# Patient Record
Sex: Male | Born: 1935
Health system: Southern US, Community
[De-identification: ages and names within clinical notes are randomized; demographics above are authoritative.]

## PROBLEM LIST (undated history)

## (undated) DIAGNOSIS — F32A Depression, unspecified: Secondary | ICD-10-CM

## (undated) DIAGNOSIS — I10 Essential (primary) hypertension: Secondary | ICD-10-CM

## (undated) DIAGNOSIS — G629 Polyneuropathy, unspecified: Secondary | ICD-10-CM

## (undated) DIAGNOSIS — E7800 Pure hypercholesterolemia, unspecified: Secondary | ICD-10-CM

## (undated) DIAGNOSIS — I639 Cerebral infarction, unspecified: Secondary | ICD-10-CM

## (undated) DIAGNOSIS — E538 Deficiency of other specified B group vitamins: Secondary | ICD-10-CM

## (undated) DIAGNOSIS — E119 Type 2 diabetes mellitus without complications: Secondary | ICD-10-CM

## (undated) DIAGNOSIS — R7303 Prediabetes: Secondary | ICD-10-CM

## (undated) DIAGNOSIS — K219 Gastro-esophageal reflux disease without esophagitis: Secondary | ICD-10-CM

## (undated) DIAGNOSIS — F329 Major depressive disorder, single episode, unspecified: Secondary | ICD-10-CM

## (undated) HISTORY — DX: Depression, unspecified: F32.A

## (undated) HISTORY — DX: Type 2 diabetes mellitus without complications: E11.9

## (undated) HISTORY — DX: Deficiency of other specified B group vitamins: E53.8

## (undated) HISTORY — PX: APPENDECTOMY: SHX54

## (undated) HISTORY — PX: CATARACT EXTRACTION: SUR2

## (undated) HISTORY — DX: Pure hypercholesterolemia, unspecified: E78.00

## (undated) HISTORY — DX: Major depressive disorder, single episode, unspecified: F32.9

## (undated) HISTORY — DX: Gastro-esophageal reflux disease without esophagitis: K21.9

---

## 1998-12-28 ENCOUNTER — Ambulatory Visit (HOSPITAL_COMMUNITY): Admission: RE | Admit: 1998-12-28 | Discharge: 1998-12-28 | Payer: Self-pay | Admitting: Endocrinology

## 1998-12-28 ENCOUNTER — Encounter: Payer: Self-pay | Admitting: Endocrinology

## 2013-12-23 ENCOUNTER — Encounter: Payer: Self-pay | Admitting: Neurology

## 2014-01-09 ENCOUNTER — Ambulatory Visit (INDEPENDENT_AMBULATORY_CARE_PROVIDER_SITE_OTHER): Payer: Medicare PPO | Admitting: Neurology

## 2014-01-09 ENCOUNTER — Encounter: Payer: Self-pay | Admitting: Neurology

## 2014-01-09 VITALS — BP 150/66 | HR 96 | Resp 25 | Ht 70.0 in | Wt 188.0 lb

## 2014-01-09 DIAGNOSIS — E1149 Type 2 diabetes mellitus with other diabetic neurological complication: Secondary | ICD-10-CM

## 2014-01-09 DIAGNOSIS — E1142 Type 2 diabetes mellitus with diabetic polyneuropathy: Secondary | ICD-10-CM

## 2014-01-09 NOTE — Progress Notes (Signed)
Joseph Arellano was seen today in the movement disorders clinic for neurologic consultation at the request of Julian Hy, MD.  The consultation is for the evaluation of falls, unsteady gait and to r/o PD.  Pt states that he fell about 1 month ago and that is what got him here.  He was outside walking in the park and doesn't know if he tripped over an uneven sidewalk, but he fell on his face; he was most concerned that he didn't use his hands to catch the fall.   No LOC.  No other falls.  He has had some unsteady gait but relates that to dizziness when he first gets up.      Specific Symptoms:  Tremor: No. Voice: no changes Sleep: sleeps well  Vivid Dreams:  Yes.    Acting out dreams:  No. Wet Pillows: No. Postural symptoms:  Yes.    Falls?  Yes.  , only one, as above Bradykinesia symptoms: no bradykinesia noted Loss of smell:  Yes.   but it never worked well Loss of taste:  Yes.   Urinary Incontinence:  No. Difficulty Swallowing:  No. Handwriting, micrographia: No. Trouble with ADL's:  No.  Trouble buttoning clothing: No. Depression:  No. Memory changes:  No. Hallucinations:  No.  visual distortions: No. N/V:  No. Lightheaded:  Yes.    Syncope: No. Diplopia:  No. Dyskinesia:  No.  Neuroimaging has not previously been performed (at least in the last 20 years).    PREVIOUS MEDICATIONS: none to date  ALLERGIES:  No Known Allergies  CURRENT MEDICATIONS:  Outpatient Encounter Prescriptions as of 01/09/2014  Medication Sig  . citalopram (CELEXA) 40 MG tablet Take 40 mg by mouth daily.  . colestipol (COLESTID) 1 G tablet Take 1 g by mouth 2 (two) times daily.  Marland Kitchen FIBER PO Take by mouth.  . glyBURIDE micronized (GLYNASE) 3 MG tablet Take 3 mg by mouth daily with breakfast.  . magnesium oxide (MAG-OX) 400 MG tablet Take 400 mg by mouth daily.  . metFORMIN (GLUCOPHAGE) 1000 MG tablet Take 1,000 mg by mouth daily.   . Multiple Vitamin (MULTIVITAMIN) tablet Take 1 tablet  by mouth daily.  . Omega-3 Fatty Acids (FISH OIL PO) Take by mouth.  Marland Kitchen omeprazole (PRILOSEC) 20 MG capsule Take 20 mg by mouth daily.  . Probiotic Product (PROBIOTIC PO) Take by mouth.  . vitamin B-12 (CYANOCOBALAMIN) 1000 MCG tablet Take 1,000 mcg by mouth daily.  . [DISCONTINUED] Lactobacillus (ACIDOPHILUS PO) Take by mouth daily.    PAST MEDICAL HISTORY:   Past Medical History  Diagnosis Date  . Depression   . B12 deficiency   . GERD (gastroesophageal reflux disease)   . Diabetes   . Hypercholesteremia     PAST SURGICAL HISTORY:   Past Surgical History  Procedure Laterality Date  . Cataract extraction Bilateral     SOCIAL HISTORY:   History   Social History  . Marital Status: Married    Spouse Name: N/A    Number of Children: N/A  . Years of Education: N/A   Occupational History  . retired     Diplomatic Services operational officer   Social History Main Topics  . Smoking status: Former Games developer  . Smokeless tobacco: Not on file  . Alcohol Use: Yes     Comment: glass of wine every night (sometimes 2 max)  . Drug Use: No  . Sexual Activity: Not on file   Other Topics Concern  . Not on file  Social History Narrative  . No narrative on file    FAMILY HISTORY:   Family Status  Relation Status Death Age  . Father Deceased     pneumonia, diabetes  . Mother Deceased     hydrocephalous  . Sister Alive     healthy  . Daughter Alive     seizures  . Daughter Alive     healthy  . Daughter Alive     healthy  . Daughter Alive     healthy    ROS:  Has paresthesias in the feet.  A complete 10 system review of systems was obtained and was unremarkable apart from what is mentioned above.  PHYSICAL EXAMINATION:    VITALS:   Filed Vitals:   01/09/14 0927  BP: 150/66  Pulse: 96  Resp: 25  Height:  (1.778 m)  Weight: 188 lb (85.276 kg)    GEN:  The patient appears stated age and is in NAD. HEENT:  Normocephalic, atraumatic.  The mucous membranes are moist. The superficial  temporal arteries are without ropiness or tenderness. CV:  RRR Lungs:  CTAB Neck/HEME:  There are no carotid bruits bilaterally.  Neurological examination:  Orientation: The patient is alert and oriented x3. Fund of knowledge is appropriate.  Recent and remote memory are intact.  Attention and concentration are normal.    Able to name objects and repeat phrases. Cranial nerves: There is good facial symmetry. Pupils are equal round and reactive to light bilaterally. Fundoscopic exam reveals clear margins bilaterally. Extraocular muscles are intact. The visual fields are full to confrontational testing. The speech is fluent and clear. Soft palate rises symmetrically and there is no tongue deviation. Hearing is intact to conversational tone. Sensation: Sensation is intact to light and pinprick throughout (facial, trunk, extremities). Vibration is decreased at the bilateral big toe. There is no extinction with double simultaneous stimulation. There is no sensory dermatomal level identified. Motor: Strength is 5/5 in the bilateral upper and lower extremities.   Shoulder shrug is equal and symmetric.  There is no pronator drift. Deep tendon reflexes: Deep tendon reflexes are 2/4 at the bilateral biceps, triceps, brachioradialis, patella and absent at the bilateral achilles. Plantar responses are downgoing bilaterally.  Movement examination: Tone: There is normal tone in the bilateral upper extremities.  The tone in the lower extremities is normal.  Abnormal movements: none Coordination:  There is no decremation with RAM's Gait and Station: The patient has no difficulty arising out of a deep-seated chair without the use of the hands. The patient's stride length is normal with normal arm swing.  There is mild trouble with tandem gait.  Okay heel/toe walk.  Stands in romberg position without trouble.Marland Kitchen      LABS:    The patient did bring in lab work from [facility] dated 12/17/2013.  White blood  cells were 7.8, hemoglobin 13.4, hematocrit 39.4 and platelets 266.  BUN was 18 and creatinine 1.1, sodium 141, potassium 4.5, chloride 106, CO2 28, glucose was elevated at 244.  AST was 37, ALT was 37.  ASSESSMENT/PLAN:  1.  Gait instability.  -long talk with the patient today.  I see no evidence of a neurodegenerative condition like PD.    -I do think that the patient has peripheral neuropathy, likely due to DM.  Talked about safety.  Greater than 50% of visit in counseling.    -Next time that labs are drawn at PCP, would recommend drawing folate, RPR, SPEP/UPEP with immunofix to  make sure not missing other reversible causes of PN.  He is on B12 injections and reports last HgBA1C was 7.0  -f/u prn

## 2014-11-30 DIAGNOSIS — Z6826 Body mass index (BMI) 26.0-26.9, adult: Secondary | ICD-10-CM | POA: Diagnosis not present

## 2014-11-30 DIAGNOSIS — E1142 Type 2 diabetes mellitus with diabetic polyneuropathy: Secondary | ICD-10-CM | POA: Diagnosis not present

## 2014-11-30 DIAGNOSIS — D510 Vitamin B12 deficiency anemia due to intrinsic factor deficiency: Secondary | ICD-10-CM | POA: Diagnosis not present

## 2014-11-30 DIAGNOSIS — R946 Abnormal results of thyroid function studies: Secondary | ICD-10-CM | POA: Diagnosis not present

## 2014-11-30 DIAGNOSIS — F329 Major depressive disorder, single episode, unspecified: Secondary | ICD-10-CM | POA: Diagnosis not present

## 2014-11-30 DIAGNOSIS — K219 Gastro-esophageal reflux disease without esophagitis: Secondary | ICD-10-CM | POA: Diagnosis not present

## 2014-11-30 DIAGNOSIS — R269 Unspecified abnormalities of gait and mobility: Secondary | ICD-10-CM | POA: Diagnosis not present

## 2014-11-30 DIAGNOSIS — E1140 Type 2 diabetes mellitus with diabetic neuropathy, unspecified: Secondary | ICD-10-CM | POA: Diagnosis not present

## 2014-12-04 DIAGNOSIS — E1140 Type 2 diabetes mellitus with diabetic neuropathy, unspecified: Secondary | ICD-10-CM | POA: Diagnosis not present

## 2014-12-04 DIAGNOSIS — R269 Unspecified abnormalities of gait and mobility: Secondary | ICD-10-CM | POA: Diagnosis not present

## 2014-12-04 DIAGNOSIS — R42 Dizziness and giddiness: Secondary | ICD-10-CM | POA: Diagnosis not present

## 2014-12-04 DIAGNOSIS — Z6826 Body mass index (BMI) 26.0-26.9, adult: Secondary | ICD-10-CM | POA: Diagnosis not present

## 2015-03-01 DIAGNOSIS — E1142 Type 2 diabetes mellitus with diabetic polyneuropathy: Secondary | ICD-10-CM | POA: Diagnosis not present

## 2015-03-01 DIAGNOSIS — F329 Major depressive disorder, single episode, unspecified: Secondary | ICD-10-CM | POA: Diagnosis not present

## 2015-03-01 DIAGNOSIS — E785 Hyperlipidemia, unspecified: Secondary | ICD-10-CM | POA: Diagnosis not present

## 2015-03-01 DIAGNOSIS — R269 Unspecified abnormalities of gait and mobility: Secondary | ICD-10-CM | POA: Diagnosis not present

## 2015-03-01 DIAGNOSIS — D510 Vitamin B12 deficiency anemia due to intrinsic factor deficiency: Secondary | ICD-10-CM | POA: Diagnosis not present

## 2015-03-01 DIAGNOSIS — R946 Abnormal results of thyroid function studies: Secondary | ICD-10-CM | POA: Diagnosis not present

## 2015-03-01 DIAGNOSIS — K219 Gastro-esophageal reflux disease without esophagitis: Secondary | ICD-10-CM | POA: Diagnosis not present

## 2015-03-01 DIAGNOSIS — E1149 Type 2 diabetes mellitus with other diabetic neurological complication: Secondary | ICD-10-CM | POA: Diagnosis not present

## 2015-03-01 DIAGNOSIS — E1140 Type 2 diabetes mellitus with diabetic neuropathy, unspecified: Secondary | ICD-10-CM | POA: Diagnosis not present

## 2015-05-25 DIAGNOSIS — H903 Sensorineural hearing loss, bilateral: Secondary | ICD-10-CM | POA: Diagnosis not present

## 2015-06-29 DIAGNOSIS — E784 Other hyperlipidemia: Secondary | ICD-10-CM | POA: Diagnosis not present

## 2015-06-29 DIAGNOSIS — R946 Abnormal results of thyroid function studies: Secondary | ICD-10-CM | POA: Diagnosis not present

## 2015-06-29 DIAGNOSIS — E1142 Type 2 diabetes mellitus with diabetic polyneuropathy: Secondary | ICD-10-CM | POA: Diagnosis not present

## 2015-06-29 DIAGNOSIS — D510 Vitamin B12 deficiency anemia due to intrinsic factor deficiency: Secondary | ICD-10-CM | POA: Diagnosis not present

## 2015-06-29 DIAGNOSIS — Z1389 Encounter for screening for other disorder: Secondary | ICD-10-CM | POA: Diagnosis not present

## 2015-06-29 DIAGNOSIS — E1140 Type 2 diabetes mellitus with diabetic neuropathy, unspecified: Secondary | ICD-10-CM | POA: Diagnosis not present

## 2015-06-29 DIAGNOSIS — Z6826 Body mass index (BMI) 26.0-26.9, adult: Secondary | ICD-10-CM | POA: Diagnosis not present

## 2015-06-29 DIAGNOSIS — R2689 Other abnormalities of gait and mobility: Secondary | ICD-10-CM | POA: Diagnosis not present

## 2015-06-29 DIAGNOSIS — K219 Gastro-esophageal reflux disease without esophagitis: Secondary | ICD-10-CM | POA: Diagnosis not present

## 2015-08-02 DIAGNOSIS — E1140 Type 2 diabetes mellitus with diabetic neuropathy, unspecified: Secondary | ICD-10-CM | POA: Diagnosis not present

## 2015-08-02 DIAGNOSIS — Z6826 Body mass index (BMI) 26.0-26.9, adult: Secondary | ICD-10-CM | POA: Diagnosis not present

## 2015-10-20 DIAGNOSIS — E1140 Type 2 diabetes mellitus with diabetic neuropathy, unspecified: Secondary | ICD-10-CM | POA: Diagnosis not present

## 2015-10-20 DIAGNOSIS — Z6826 Body mass index (BMI) 26.0-26.9, adult: Secondary | ICD-10-CM | POA: Diagnosis not present

## 2015-11-09 DIAGNOSIS — R2689 Other abnormalities of gait and mobility: Secondary | ICD-10-CM | POA: Diagnosis not present

## 2015-11-09 DIAGNOSIS — E1149 Type 2 diabetes mellitus with other diabetic neurological complication: Secondary | ICD-10-CM | POA: Diagnosis not present

## 2015-11-09 DIAGNOSIS — E784 Other hyperlipidemia: Secondary | ICD-10-CM | POA: Diagnosis not present

## 2015-11-09 DIAGNOSIS — M109 Gout, unspecified: Secondary | ICD-10-CM | POA: Diagnosis not present

## 2015-11-09 DIAGNOSIS — R946 Abnormal results of thyroid function studies: Secondary | ICD-10-CM | POA: Diagnosis not present

## 2015-11-09 DIAGNOSIS — Z6826 Body mass index (BMI) 26.0-26.9, adult: Secondary | ICD-10-CM | POA: Diagnosis not present

## 2015-11-09 DIAGNOSIS — D510 Vitamin B12 deficiency anemia due to intrinsic factor deficiency: Secondary | ICD-10-CM | POA: Diagnosis not present

## 2015-11-09 DIAGNOSIS — K219 Gastro-esophageal reflux disease without esophagitis: Secondary | ICD-10-CM | POA: Diagnosis not present

## 2015-12-28 DIAGNOSIS — L814 Other melanin hyperpigmentation: Secondary | ICD-10-CM | POA: Diagnosis not present

## 2015-12-28 DIAGNOSIS — L821 Other seborrheic keratosis: Secondary | ICD-10-CM | POA: Diagnosis not present

## 2015-12-28 DIAGNOSIS — L570 Actinic keratosis: Secondary | ICD-10-CM | POA: Diagnosis not present

## 2015-12-28 DIAGNOSIS — L820 Inflamed seborrheic keratosis: Secondary | ICD-10-CM | POA: Diagnosis not present

## 2015-12-28 DIAGNOSIS — D225 Melanocytic nevi of trunk: Secondary | ICD-10-CM | POA: Diagnosis not present

## 2015-12-28 DIAGNOSIS — D485 Neoplasm of uncertain behavior of skin: Secondary | ICD-10-CM | POA: Diagnosis not present

## 2015-12-28 DIAGNOSIS — D1801 Hemangioma of skin and subcutaneous tissue: Secondary | ICD-10-CM | POA: Diagnosis not present

## 2016-02-29 DIAGNOSIS — E1149 Type 2 diabetes mellitus with other diabetic neurological complication: Secondary | ICD-10-CM | POA: Diagnosis not present

## 2016-02-29 DIAGNOSIS — Z6826 Body mass index (BMI) 26.0-26.9, adult: Secondary | ICD-10-CM | POA: Diagnosis not present

## 2016-03-14 DIAGNOSIS — M109 Gout, unspecified: Secondary | ICD-10-CM | POA: Diagnosis not present

## 2016-03-14 DIAGNOSIS — E1140 Type 2 diabetes mellitus with diabetic neuropathy, unspecified: Secondary | ICD-10-CM | POA: Diagnosis not present

## 2016-03-14 DIAGNOSIS — R42 Dizziness and giddiness: Secondary | ICD-10-CM | POA: Diagnosis not present

## 2016-03-14 DIAGNOSIS — E784 Other hyperlipidemia: Secondary | ICD-10-CM | POA: Diagnosis not present

## 2016-03-14 DIAGNOSIS — R269 Unspecified abnormalities of gait and mobility: Secondary | ICD-10-CM | POA: Diagnosis not present

## 2016-03-14 DIAGNOSIS — E1142 Type 2 diabetes mellitus with diabetic polyneuropathy: Secondary | ICD-10-CM | POA: Diagnosis not present

## 2016-03-14 DIAGNOSIS — Z6825 Body mass index (BMI) 25.0-25.9, adult: Secondary | ICD-10-CM | POA: Diagnosis not present

## 2016-03-14 DIAGNOSIS — G3184 Mild cognitive impairment, so stated: Secondary | ICD-10-CM | POA: Diagnosis not present

## 2016-03-14 DIAGNOSIS — R946 Abnormal results of thyroid function studies: Secondary | ICD-10-CM | POA: Diagnosis not present

## 2016-06-12 DIAGNOSIS — E1140 Type 2 diabetes mellitus with diabetic neuropathy, unspecified: Secondary | ICD-10-CM | POA: Diagnosis not present

## 2016-06-12 DIAGNOSIS — R269 Unspecified abnormalities of gait and mobility: Secondary | ICD-10-CM | POA: Diagnosis not present

## 2016-06-12 DIAGNOSIS — F329 Major depressive disorder, single episode, unspecified: Secondary | ICD-10-CM | POA: Diagnosis not present

## 2016-06-12 DIAGNOSIS — R946 Abnormal results of thyroid function studies: Secondary | ICD-10-CM | POA: Diagnosis not present

## 2016-06-12 DIAGNOSIS — G3184 Mild cognitive impairment, so stated: Secondary | ICD-10-CM | POA: Diagnosis not present

## 2016-06-12 DIAGNOSIS — D510 Vitamin B12 deficiency anemia due to intrinsic factor deficiency: Secondary | ICD-10-CM | POA: Diagnosis not present

## 2016-06-12 DIAGNOSIS — E784 Other hyperlipidemia: Secondary | ICD-10-CM | POA: Diagnosis not present

## 2016-06-12 DIAGNOSIS — K219 Gastro-esophageal reflux disease without esophagitis: Secondary | ICD-10-CM | POA: Diagnosis not present

## 2016-06-12 DIAGNOSIS — E1142 Type 2 diabetes mellitus with diabetic polyneuropathy: Secondary | ICD-10-CM | POA: Diagnosis not present

## 2016-06-21 DIAGNOSIS — D1801 Hemangioma of skin and subcutaneous tissue: Secondary | ICD-10-CM | POA: Diagnosis not present

## 2016-06-21 DIAGNOSIS — L281 Prurigo nodularis: Secondary | ICD-10-CM | POA: Diagnosis not present

## 2016-06-21 DIAGNOSIS — L821 Other seborrheic keratosis: Secondary | ICD-10-CM | POA: Diagnosis not present

## 2016-09-11 DIAGNOSIS — L988 Other specified disorders of the skin and subcutaneous tissue: Secondary | ICD-10-CM | POA: Diagnosis not present

## 2016-09-11 DIAGNOSIS — E784 Other hyperlipidemia: Secondary | ICD-10-CM | POA: Diagnosis not present

## 2016-09-11 DIAGNOSIS — R946 Abnormal results of thyroid function studies: Secondary | ICD-10-CM | POA: Diagnosis not present

## 2016-09-11 DIAGNOSIS — R269 Unspecified abnormalities of gait and mobility: Secondary | ICD-10-CM | POA: Diagnosis not present

## 2016-09-11 DIAGNOSIS — E1140 Type 2 diabetes mellitus with diabetic neuropathy, unspecified: Secondary | ICD-10-CM | POA: Diagnosis not present

## 2016-09-11 DIAGNOSIS — D510 Vitamin B12 deficiency anemia due to intrinsic factor deficiency: Secondary | ICD-10-CM | POA: Diagnosis not present

## 2016-09-11 DIAGNOSIS — E1142 Type 2 diabetes mellitus with diabetic polyneuropathy: Secondary | ICD-10-CM | POA: Diagnosis not present

## 2016-09-11 DIAGNOSIS — E1149 Type 2 diabetes mellitus with other diabetic neurological complication: Secondary | ICD-10-CM | POA: Diagnosis not present

## 2016-09-11 DIAGNOSIS — G3184 Mild cognitive impairment, so stated: Secondary | ICD-10-CM | POA: Diagnosis not present

## 2016-09-13 DIAGNOSIS — Z6826 Body mass index (BMI) 26.0-26.9, adult: Secondary | ICD-10-CM | POA: Diagnosis not present

## 2016-09-13 DIAGNOSIS — E1140 Type 2 diabetes mellitus with diabetic neuropathy, unspecified: Secondary | ICD-10-CM | POA: Diagnosis not present

## 2016-09-28 DIAGNOSIS — H40052 Ocular hypertension, left eye: Secondary | ICD-10-CM | POA: Diagnosis not present

## 2016-09-28 DIAGNOSIS — H3540 Unspecified peripheral retinal degeneration: Secondary | ICD-10-CM | POA: Diagnosis not present

## 2016-09-28 DIAGNOSIS — E119 Type 2 diabetes mellitus without complications: Secondary | ICD-10-CM | POA: Diagnosis not present

## 2016-09-28 DIAGNOSIS — E11319 Type 2 diabetes mellitus with unspecified diabetic retinopathy without macular edema: Secondary | ICD-10-CM | POA: Diagnosis not present

## 2016-09-28 DIAGNOSIS — H5203 Hypermetropia, bilateral: Secondary | ICD-10-CM | POA: Diagnosis not present

## 2016-09-28 DIAGNOSIS — H43823 Vitreomacular adhesion, bilateral: Secondary | ICD-10-CM | POA: Diagnosis not present

## 2016-09-28 DIAGNOSIS — H52223 Regular astigmatism, bilateral: Secondary | ICD-10-CM | POA: Diagnosis not present

## 2016-10-25 DIAGNOSIS — Z6825 Body mass index (BMI) 25.0-25.9, adult: Secondary | ICD-10-CM | POA: Diagnosis not present

## 2016-10-25 DIAGNOSIS — E1140 Type 2 diabetes mellitus with diabetic neuropathy, unspecified: Secondary | ICD-10-CM | POA: Diagnosis not present

## 2017-01-10 DIAGNOSIS — E784 Other hyperlipidemia: Secondary | ICD-10-CM | POA: Diagnosis not present

## 2017-01-10 DIAGNOSIS — G3184 Mild cognitive impairment, so stated: Secondary | ICD-10-CM | POA: Diagnosis not present

## 2017-01-10 DIAGNOSIS — R946 Abnormal results of thyroid function studies: Secondary | ICD-10-CM | POA: Diagnosis not present

## 2017-01-10 DIAGNOSIS — E1142 Type 2 diabetes mellitus with diabetic polyneuropathy: Secondary | ICD-10-CM | POA: Diagnosis not present

## 2017-01-10 DIAGNOSIS — K5900 Constipation, unspecified: Secondary | ICD-10-CM | POA: Diagnosis not present

## 2017-01-10 DIAGNOSIS — Z6825 Body mass index (BMI) 25.0-25.9, adult: Secondary | ICD-10-CM | POA: Diagnosis not present

## 2017-01-10 DIAGNOSIS — Z1389 Encounter for screening for other disorder: Secondary | ICD-10-CM | POA: Diagnosis not present

## 2017-01-10 DIAGNOSIS — E1149 Type 2 diabetes mellitus with other diabetic neurological complication: Secondary | ICD-10-CM | POA: Diagnosis not present

## 2017-02-21 DIAGNOSIS — Z6826 Body mass index (BMI) 26.0-26.9, adult: Secondary | ICD-10-CM | POA: Diagnosis not present

## 2017-02-21 DIAGNOSIS — E1149 Type 2 diabetes mellitus with other diabetic neurological complication: Secondary | ICD-10-CM | POA: Diagnosis not present

## 2017-02-21 DIAGNOSIS — Z794 Long term (current) use of insulin: Secondary | ICD-10-CM | POA: Diagnosis not present

## 2017-03-20 DIAGNOSIS — E7849 Other hyperlipidemia: Secondary | ICD-10-CM | POA: Diagnosis not present

## 2017-03-20 DIAGNOSIS — R946 Abnormal results of thyroid function studies: Secondary | ICD-10-CM | POA: Diagnosis not present

## 2017-03-20 DIAGNOSIS — G3184 Mild cognitive impairment, so stated: Secondary | ICD-10-CM | POA: Diagnosis not present

## 2017-03-20 DIAGNOSIS — E1142 Type 2 diabetes mellitus with diabetic polyneuropathy: Secondary | ICD-10-CM | POA: Diagnosis not present

## 2017-03-20 DIAGNOSIS — R2689 Other abnormalities of gait and mobility: Secondary | ICD-10-CM | POA: Diagnosis not present

## 2017-03-20 DIAGNOSIS — M1000 Idiopathic gout, unspecified site: Secondary | ICD-10-CM | POA: Diagnosis not present

## 2017-03-20 DIAGNOSIS — E1140 Type 2 diabetes mellitus with diabetic neuropathy, unspecified: Secondary | ICD-10-CM | POA: Diagnosis not present

## 2017-03-20 DIAGNOSIS — K219 Gastro-esophageal reflux disease without esophagitis: Secondary | ICD-10-CM | POA: Diagnosis not present

## 2017-03-20 DIAGNOSIS — D510 Vitamin B12 deficiency anemia due to intrinsic factor deficiency: Secondary | ICD-10-CM | POA: Diagnosis not present

## 2017-04-19 DIAGNOSIS — Z6827 Body mass index (BMI) 27.0-27.9, adult: Secondary | ICD-10-CM | POA: Diagnosis not present

## 2017-04-19 DIAGNOSIS — J069 Acute upper respiratory infection, unspecified: Secondary | ICD-10-CM | POA: Diagnosis not present

## 2017-04-19 DIAGNOSIS — J029 Acute pharyngitis, unspecified: Secondary | ICD-10-CM | POA: Diagnosis not present

## 2017-04-19 DIAGNOSIS — R05 Cough: Secondary | ICD-10-CM | POA: Diagnosis not present

## 2017-05-23 DIAGNOSIS — E1149 Type 2 diabetes mellitus with other diabetic neurological complication: Secondary | ICD-10-CM | POA: Diagnosis not present

## 2017-05-23 DIAGNOSIS — Z6827 Body mass index (BMI) 27.0-27.9, adult: Secondary | ICD-10-CM | POA: Diagnosis not present

## 2017-05-23 DIAGNOSIS — Z794 Long term (current) use of insulin: Secondary | ICD-10-CM | POA: Diagnosis not present

## 2017-05-24 DIAGNOSIS — H40053 Ocular hypertension, bilateral: Secondary | ICD-10-CM | POA: Diagnosis not present

## 2017-05-30 DIAGNOSIS — L820 Inflamed seborrheic keratosis: Secondary | ICD-10-CM | POA: Diagnosis not present

## 2017-05-30 DIAGNOSIS — L281 Prurigo nodularis: Secondary | ICD-10-CM | POA: Diagnosis not present

## 2017-08-08 DIAGNOSIS — D510 Vitamin B12 deficiency anemia due to intrinsic factor deficiency: Secondary | ICD-10-CM | POA: Diagnosis not present

## 2017-08-08 DIAGNOSIS — K219 Gastro-esophageal reflux disease without esophagitis: Secondary | ICD-10-CM | POA: Diagnosis not present

## 2017-08-08 DIAGNOSIS — E7849 Other hyperlipidemia: Secondary | ICD-10-CM | POA: Diagnosis not present

## 2017-08-08 DIAGNOSIS — Z794 Long term (current) use of insulin: Secondary | ICD-10-CM | POA: Diagnosis not present

## 2017-08-08 DIAGNOSIS — R946 Abnormal results of thyroid function studies: Secondary | ICD-10-CM | POA: Diagnosis not present

## 2017-08-08 DIAGNOSIS — E1149 Type 2 diabetes mellitus with other diabetic neurological complication: Secondary | ICD-10-CM | POA: Diagnosis not present

## 2017-08-08 DIAGNOSIS — E1142 Type 2 diabetes mellitus with diabetic polyneuropathy: Secondary | ICD-10-CM | POA: Diagnosis not present

## 2017-08-08 DIAGNOSIS — F329 Major depressive disorder, single episode, unspecified: Secondary | ICD-10-CM | POA: Diagnosis not present

## 2017-08-08 DIAGNOSIS — G3184 Mild cognitive impairment, so stated: Secondary | ICD-10-CM | POA: Diagnosis not present

## 2017-10-16 DIAGNOSIS — Z6826 Body mass index (BMI) 26.0-26.9, adult: Secondary | ICD-10-CM | POA: Diagnosis not present

## 2017-10-16 DIAGNOSIS — E1142 Type 2 diabetes mellitus with diabetic polyneuropathy: Secondary | ICD-10-CM | POA: Diagnosis not present

## 2017-10-16 DIAGNOSIS — E663 Overweight: Secondary | ICD-10-CM | POA: Diagnosis not present

## 2017-12-13 DIAGNOSIS — E7849 Other hyperlipidemia: Secondary | ICD-10-CM | POA: Diagnosis not present

## 2017-12-13 DIAGNOSIS — F329 Major depressive disorder, single episode, unspecified: Secondary | ICD-10-CM | POA: Diagnosis not present

## 2017-12-13 DIAGNOSIS — G3184 Mild cognitive impairment, so stated: Secondary | ICD-10-CM | POA: Diagnosis not present

## 2017-12-13 DIAGNOSIS — Z794 Long term (current) use of insulin: Secondary | ICD-10-CM | POA: Diagnosis not present

## 2017-12-13 DIAGNOSIS — D510 Vitamin B12 deficiency anemia due to intrinsic factor deficiency: Secondary | ICD-10-CM | POA: Diagnosis not present

## 2017-12-13 DIAGNOSIS — E1142 Type 2 diabetes mellitus with diabetic polyneuropathy: Secondary | ICD-10-CM | POA: Diagnosis not present

## 2017-12-13 DIAGNOSIS — Z1389 Encounter for screening for other disorder: Secondary | ICD-10-CM | POA: Diagnosis not present

## 2017-12-13 DIAGNOSIS — R946 Abnormal results of thyroid function studies: Secondary | ICD-10-CM | POA: Diagnosis not present

## 2018-03-21 DIAGNOSIS — Z23 Encounter for immunization: Secondary | ICD-10-CM | POA: Diagnosis not present

## 2018-04-10 DIAGNOSIS — H35372 Puckering of macula, left eye: Secondary | ICD-10-CM | POA: Diagnosis not present

## 2018-04-10 DIAGNOSIS — H43822 Vitreomacular adhesion, left eye: Secondary | ICD-10-CM | POA: Diagnosis not present

## 2018-04-10 DIAGNOSIS — H40052 Ocular hypertension, left eye: Secondary | ICD-10-CM | POA: Diagnosis not present

## 2018-04-10 DIAGNOSIS — H35361 Drusen (degenerative) of macula, right eye: Secondary | ICD-10-CM | POA: Diagnosis not present

## 2018-04-10 DIAGNOSIS — H40053 Ocular hypertension, bilateral: Secondary | ICD-10-CM | POA: Diagnosis not present

## 2018-04-10 DIAGNOSIS — H40051 Ocular hypertension, right eye: Secondary | ICD-10-CM | POA: Diagnosis not present

## 2018-04-10 DIAGNOSIS — H35362 Drusen (degenerative) of macula, left eye: Secondary | ICD-10-CM | POA: Diagnosis not present

## 2018-04-10 DIAGNOSIS — H40013 Open angle with borderline findings, low risk, bilateral: Secondary | ICD-10-CM | POA: Diagnosis not present

## 2018-04-15 DIAGNOSIS — G3184 Mild cognitive impairment, so stated: Secondary | ICD-10-CM | POA: Diagnosis not present

## 2018-04-15 DIAGNOSIS — F329 Major depressive disorder, single episode, unspecified: Secondary | ICD-10-CM | POA: Diagnosis not present

## 2018-04-15 DIAGNOSIS — R269 Unspecified abnormalities of gait and mobility: Secondary | ICD-10-CM | POA: Diagnosis not present

## 2018-04-15 DIAGNOSIS — D510 Vitamin B12 deficiency anemia due to intrinsic factor deficiency: Secondary | ICD-10-CM | POA: Diagnosis not present

## 2018-04-15 DIAGNOSIS — E1142 Type 2 diabetes mellitus with diabetic polyneuropathy: Secondary | ICD-10-CM | POA: Diagnosis not present

## 2018-04-15 DIAGNOSIS — M109 Gout, unspecified: Secondary | ICD-10-CM | POA: Diagnosis not present

## 2018-04-15 DIAGNOSIS — E7849 Other hyperlipidemia: Secondary | ICD-10-CM | POA: Diagnosis not present

## 2018-04-15 DIAGNOSIS — E1149 Type 2 diabetes mellitus with other diabetic neurological complication: Secondary | ICD-10-CM | POA: Diagnosis not present

## 2018-07-12 DIAGNOSIS — T162XXA Foreign body in left ear, initial encounter: Secondary | ICD-10-CM | POA: Diagnosis not present

## 2018-10-10 DIAGNOSIS — Z961 Presence of intraocular lens: Secondary | ICD-10-CM | POA: Diagnosis not present

## 2018-10-10 DIAGNOSIS — E119 Type 2 diabetes mellitus without complications: Secondary | ICD-10-CM | POA: Diagnosis not present

## 2018-10-10 DIAGNOSIS — H04123 Dry eye syndrome of bilateral lacrimal glands: Secondary | ICD-10-CM | POA: Diagnosis not present

## 2018-10-10 DIAGNOSIS — E11319 Type 2 diabetes mellitus with unspecified diabetic retinopathy without macular edema: Secondary | ICD-10-CM | POA: Diagnosis not present

## 2018-10-10 DIAGNOSIS — H52222 Regular astigmatism, left eye: Secondary | ICD-10-CM | POA: Diagnosis not present

## 2018-10-10 DIAGNOSIS — H5201 Hypermetropia, right eye: Secondary | ICD-10-CM | POA: Diagnosis not present

## 2018-10-10 DIAGNOSIS — H524 Presbyopia: Secondary | ICD-10-CM | POA: Diagnosis not present

## 2018-10-10 DIAGNOSIS — H5212 Myopia, left eye: Secondary | ICD-10-CM | POA: Diagnosis not present

## 2018-10-10 DIAGNOSIS — H43393 Other vitreous opacities, bilateral: Secondary | ICD-10-CM | POA: Diagnosis not present

## 2018-10-29 DIAGNOSIS — Z794 Long term (current) use of insulin: Secondary | ICD-10-CM | POA: Diagnosis not present

## 2018-10-29 DIAGNOSIS — E785 Hyperlipidemia, unspecified: Secondary | ICD-10-CM | POA: Diagnosis not present

## 2018-10-29 DIAGNOSIS — R946 Abnormal results of thyroid function studies: Secondary | ICD-10-CM | POA: Diagnosis not present

## 2018-10-29 DIAGNOSIS — G3184 Mild cognitive impairment, so stated: Secondary | ICD-10-CM | POA: Diagnosis not present

## 2018-10-29 DIAGNOSIS — K219 Gastro-esophageal reflux disease without esophagitis: Secondary | ICD-10-CM | POA: Diagnosis not present

## 2018-10-29 DIAGNOSIS — D510 Vitamin B12 deficiency anemia due to intrinsic factor deficiency: Secondary | ICD-10-CM | POA: Diagnosis not present

## 2018-10-29 DIAGNOSIS — E1149 Type 2 diabetes mellitus with other diabetic neurological complication: Secondary | ICD-10-CM | POA: Diagnosis not present

## 2018-10-29 DIAGNOSIS — E1142 Type 2 diabetes mellitus with diabetic polyneuropathy: Secondary | ICD-10-CM | POA: Diagnosis not present

## 2018-10-29 DIAGNOSIS — M109 Gout, unspecified: Secondary | ICD-10-CM | POA: Diagnosis not present

## 2018-10-31 DIAGNOSIS — E7849 Other hyperlipidemia: Secondary | ICD-10-CM | POA: Diagnosis not present

## 2018-10-31 DIAGNOSIS — D510 Vitamin B12 deficiency anemia due to intrinsic factor deficiency: Secondary | ICD-10-CM | POA: Diagnosis not present

## 2018-10-31 DIAGNOSIS — E1149 Type 2 diabetes mellitus with other diabetic neurological complication: Secondary | ICD-10-CM | POA: Diagnosis not present

## 2018-10-31 DIAGNOSIS — M109 Gout, unspecified: Secondary | ICD-10-CM | POA: Diagnosis not present

## 2018-11-01 DIAGNOSIS — E1149 Type 2 diabetes mellitus with other diabetic neurological complication: Secondary | ICD-10-CM | POA: Diagnosis not present

## 2019-02-03 DIAGNOSIS — H9193 Unspecified hearing loss, bilateral: Secondary | ICD-10-CM | POA: Diagnosis not present

## 2019-02-03 DIAGNOSIS — Z794 Long term (current) use of insulin: Secondary | ICD-10-CM | POA: Diagnosis not present

## 2019-02-03 DIAGNOSIS — H547 Unspecified visual loss: Secondary | ICD-10-CM | POA: Diagnosis not present

## 2019-02-03 DIAGNOSIS — E1151 Type 2 diabetes mellitus with diabetic peripheral angiopathy without gangrene: Secondary | ICD-10-CM | POA: Diagnosis not present

## 2019-02-03 DIAGNOSIS — K219 Gastro-esophageal reflux disease without esophagitis: Secondary | ICD-10-CM | POA: Diagnosis not present

## 2019-02-03 DIAGNOSIS — Z6826 Body mass index (BMI) 26.0-26.9, adult: Secondary | ICD-10-CM | POA: Diagnosis not present

## 2019-02-03 DIAGNOSIS — E785 Hyperlipidemia, unspecified: Secondary | ICD-10-CM | POA: Diagnosis not present

## 2019-02-03 DIAGNOSIS — M109 Gout, unspecified: Secondary | ICD-10-CM | POA: Diagnosis not present

## 2019-02-26 DIAGNOSIS — R946 Abnormal results of thyroid function studies: Secondary | ICD-10-CM | POA: Diagnosis not present

## 2019-02-26 DIAGNOSIS — E1142 Type 2 diabetes mellitus with diabetic polyneuropathy: Secondary | ICD-10-CM | POA: Diagnosis not present

## 2019-02-26 DIAGNOSIS — I129 Hypertensive chronic kidney disease with stage 1 through stage 4 chronic kidney disease, or unspecified chronic kidney disease: Secondary | ICD-10-CM | POA: Diagnosis not present

## 2019-02-26 DIAGNOSIS — Z794 Long term (current) use of insulin: Secondary | ICD-10-CM | POA: Diagnosis not present

## 2019-02-26 DIAGNOSIS — E785 Hyperlipidemia, unspecified: Secondary | ICD-10-CM | POA: Diagnosis not present

## 2019-02-26 DIAGNOSIS — Z0000 Encounter for general adult medical examination without abnormal findings: Secondary | ICD-10-CM | POA: Diagnosis not present

## 2019-02-26 DIAGNOSIS — Z23 Encounter for immunization: Secondary | ICD-10-CM | POA: Diagnosis not present

## 2019-02-26 DIAGNOSIS — N1831 Chronic kidney disease, stage 3a: Secondary | ICD-10-CM | POA: Diagnosis not present

## 2019-02-26 DIAGNOSIS — E1149 Type 2 diabetes mellitus with other diabetic neurological complication: Secondary | ICD-10-CM | POA: Diagnosis not present

## 2019-02-26 DIAGNOSIS — G3184 Mild cognitive impairment, so stated: Secondary | ICD-10-CM | POA: Diagnosis not present

## 2019-04-09 DIAGNOSIS — H353131 Nonexudative age-related macular degeneration, bilateral, early dry stage: Secondary | ICD-10-CM | POA: Diagnosis not present

## 2019-04-09 DIAGNOSIS — H5201 Hypermetropia, right eye: Secondary | ICD-10-CM | POA: Diagnosis not present

## 2019-04-09 DIAGNOSIS — H353111 Nonexudative age-related macular degeneration, right eye, early dry stage: Secondary | ICD-10-CM | POA: Diagnosis not present

## 2019-04-09 DIAGNOSIS — H5212 Myopia, left eye: Secondary | ICD-10-CM | POA: Diagnosis not present

## 2019-04-09 DIAGNOSIS — H353121 Nonexudative age-related macular degeneration, left eye, early dry stage: Secondary | ICD-10-CM | POA: Diagnosis not present

## 2019-04-09 DIAGNOSIS — E119 Type 2 diabetes mellitus without complications: Secondary | ICD-10-CM | POA: Diagnosis not present

## 2019-04-09 DIAGNOSIS — H52222 Regular astigmatism, left eye: Secondary | ICD-10-CM | POA: Diagnosis not present

## 2019-04-09 DIAGNOSIS — H524 Presbyopia: Secondary | ICD-10-CM | POA: Diagnosis not present

## 2019-04-17 DIAGNOSIS — Z794 Long term (current) use of insulin: Secondary | ICD-10-CM | POA: Diagnosis not present

## 2019-04-17 DIAGNOSIS — E1149 Type 2 diabetes mellitus with other diabetic neurological complication: Secondary | ICD-10-CM | POA: Diagnosis not present

## 2019-04-17 DIAGNOSIS — N183 Chronic kidney disease, stage 3 unspecified: Secondary | ICD-10-CM | POA: Diagnosis not present

## 2019-04-17 DIAGNOSIS — I10 Essential (primary) hypertension: Secondary | ICD-10-CM | POA: Diagnosis not present

## 2019-06-10 ENCOUNTER — Ambulatory Visit: Payer: Medicare PPO

## 2019-06-23 ENCOUNTER — Emergency Department (HOSPITAL_COMMUNITY): Payer: Medicare HMO

## 2019-06-23 ENCOUNTER — Other Ambulatory Visit: Payer: Self-pay

## 2019-06-23 ENCOUNTER — Encounter (HOSPITAL_COMMUNITY): Payer: Self-pay | Admitting: Neurology

## 2019-06-23 ENCOUNTER — Inpatient Hospital Stay (HOSPITAL_COMMUNITY): Payer: Medicare HMO

## 2019-06-23 ENCOUNTER — Inpatient Hospital Stay (HOSPITAL_COMMUNITY)
Admission: EM | Admit: 2019-06-23 | Discharge: 2019-06-24 | DRG: 063 | Disposition: A | Discharge status: Home Health | Payer: Medicare HMO | Attending: Neurology | Admitting: Neurology

## 2019-06-23 DIAGNOSIS — F329 Major depressive disorder, single episode, unspecified: Secondary | ICD-10-CM | POA: Diagnosis not present

## 2019-06-23 DIAGNOSIS — Z20822 Contact with and (suspected) exposure to covid-19: Secondary | ICD-10-CM | POA: Diagnosis present

## 2019-06-23 DIAGNOSIS — Z7984 Long term (current) use of oral hypoglycemic drugs: Secondary | ICD-10-CM

## 2019-06-23 DIAGNOSIS — F32A Depression, unspecified: Secondary | ICD-10-CM | POA: Diagnosis present

## 2019-06-23 DIAGNOSIS — I358 Other nonrheumatic aortic valve disorders: Secondary | ICD-10-CM | POA: Diagnosis not present

## 2019-06-23 DIAGNOSIS — I639 Cerebral infarction, unspecified: Secondary | ICD-10-CM | POA: Diagnosis not present

## 2019-06-23 DIAGNOSIS — E538 Deficiency of other specified B group vitamins: Secondary | ICD-10-CM | POA: Diagnosis present

## 2019-06-23 DIAGNOSIS — E1159 Type 2 diabetes mellitus with other circulatory complications: Secondary | ICD-10-CM | POA: Diagnosis not present

## 2019-06-23 DIAGNOSIS — I6523 Occlusion and stenosis of bilateral carotid arteries: Secondary | ICD-10-CM | POA: Diagnosis not present

## 2019-06-23 DIAGNOSIS — E1142 Type 2 diabetes mellitus with diabetic polyneuropathy: Secondary | ICD-10-CM | POA: Diagnosis present

## 2019-06-23 DIAGNOSIS — E785 Hyperlipidemia, unspecified: Secondary | ICD-10-CM | POA: Diagnosis present

## 2019-06-23 DIAGNOSIS — R531 Weakness: Secondary | ICD-10-CM | POA: Diagnosis not present

## 2019-06-23 DIAGNOSIS — R29810 Facial weakness: Secondary | ICD-10-CM | POA: Diagnosis not present

## 2019-06-23 DIAGNOSIS — E1151 Type 2 diabetes mellitus with diabetic peripheral angiopathy without gangrene: Secondary | ICD-10-CM | POA: Diagnosis present

## 2019-06-23 DIAGNOSIS — I4510 Unspecified right bundle-branch block: Secondary | ICD-10-CM | POA: Diagnosis not present

## 2019-06-23 DIAGNOSIS — E119 Type 2 diabetes mellitus without complications: Secondary | ICD-10-CM

## 2019-06-23 DIAGNOSIS — I10 Essential (primary) hypertension: Secondary | ICD-10-CM | POA: Diagnosis present

## 2019-06-23 DIAGNOSIS — Z79899 Other long term (current) drug therapy: Secondary | ICD-10-CM | POA: Diagnosis not present

## 2019-06-23 DIAGNOSIS — K219 Gastro-esophageal reflux disease without esophagitis: Secondary | ICD-10-CM | POA: Diagnosis present

## 2019-06-23 DIAGNOSIS — R000 Tachycardia, unspecified: Secondary | ICD-10-CM | POA: Diagnosis not present

## 2019-06-23 DIAGNOSIS — R29818 Other symptoms and signs involving the nervous system: Secondary | ICD-10-CM | POA: Diagnosis not present

## 2019-06-23 DIAGNOSIS — R471 Dysarthria and anarthria: Secondary | ICD-10-CM | POA: Diagnosis not present

## 2019-06-23 DIAGNOSIS — G459 Transient cerebral ischemic attack, unspecified: Secondary | ICD-10-CM | POA: Diagnosis not present

## 2019-06-23 DIAGNOSIS — Z8249 Family history of ischemic heart disease and other diseases of the circulatory system: Secondary | ICD-10-CM

## 2019-06-23 DIAGNOSIS — Z87891 Personal history of nicotine dependence: Secondary | ICD-10-CM

## 2019-06-23 DIAGNOSIS — E7800 Pure hypercholesterolemia, unspecified: Secondary | ICD-10-CM | POA: Diagnosis not present

## 2019-06-23 DIAGNOSIS — R4781 Slurred speech: Secondary | ICD-10-CM | POA: Diagnosis not present

## 2019-06-23 DIAGNOSIS — I6789 Other cerebrovascular disease: Secondary | ICD-10-CM | POA: Diagnosis not present

## 2019-06-23 DIAGNOSIS — Z9282 Status post administration of tPA (rtPA) in a different facility within the last 24 hours prior to admission to current facility: Secondary | ICD-10-CM | POA: Diagnosis not present

## 2019-06-23 HISTORY — DX: Cerebral infarction, unspecified: I63.9

## 2019-06-23 HISTORY — DX: Essential (primary) hypertension: I10

## 2019-06-23 HISTORY — DX: Polyneuropathy, unspecified: G62.9

## 2019-06-23 HISTORY — DX: Prediabetes: R73.03

## 2019-06-23 LAB — COMPREHENSIVE METABOLIC PANEL
ALT: 25 U/L (ref 0–44)
AST: 29 U/L (ref 15–41)
Albumin: 3.7 g/dL (ref 3.5–5.0)
Alkaline Phosphatase: 70 U/L (ref 38–126)
Anion gap: 10 (ref 5–15)
BUN: 13 mg/dL (ref 8–23)
CO2: 24 mmol/L (ref 22–32)
Calcium: 8.5 mg/dL — ABNORMAL LOW (ref 8.9–10.3)
Chloride: 105 mmol/L (ref 98–111)
Creatinine, Ser: 1.16 mg/dL (ref 0.61–1.24)
GFR calc Af Amer: >60 mL/min (ref >60)
GFR calc non Af Amer: 56 mL/min — ABNORMAL LOW (ref >60)
Glucose, Bld: 154 mg/dL — ABNORMAL HIGH (ref 70–99)
Potassium: 4.2 mmol/L (ref 3.5–5.1)
Sodium: 139 mmol/L (ref 135–145)
Total Bilirubin: 0.9 mg/dL (ref 0.3–1.2)
Total Protein: 7 g/dL (ref 6.5–8.1)

## 2019-06-23 LAB — CBG MONITORING, ED: Glucose-Capillary: 144 mg/dL — ABNORMAL HIGH (ref 70–99)

## 2019-06-23 LAB — I-STAT CHEM 8, ED
BUN: 14 mg/dL (ref 8–23)
Calcium, Ion: 1.05 mmol/L — ABNORMAL LOW (ref 1.15–1.40)
Chloride: 104 mmol/L (ref 98–111)
Creatinine, Ser: 1.1 mg/dL (ref 0.61–1.24)
Glucose, Bld: 157 mg/dL — ABNORMAL HIGH (ref 70–99)
HCT: 39 % (ref 39.0–52.0)
Hemoglobin: 13.3 g/dL (ref 13.0–17.0)
Potassium: 4.2 mmol/L (ref 3.5–5.1)
Sodium: 140 mmol/L (ref 135–145)
TCO2: 27 mmol/L (ref 22–32)

## 2019-06-23 LAB — DIFFERENTIAL
Abs Immature Granulocytes: 0.05 10*3/uL (ref 0.00–0.07)
Basophils Absolute: 0.1 10*3/uL (ref 0.0–0.1)
Basophils Relative: 1 %
Eosinophils Absolute: 0.1 10*3/uL (ref 0.0–0.5)
Eosinophils Relative: 1 %
Immature Granulocytes: 1 %
Lymphocytes Relative: 25 %
Lymphs Abs: 2.3 10*3/uL (ref 0.7–4.0)
Monocytes Absolute: 0.4 10*3/uL (ref 0.1–1.0)
Monocytes Relative: 4 %
Neutro Abs: 6.3 10*3/uL (ref 1.7–7.7)
Neutrophils Relative %: 68 %

## 2019-06-23 LAB — CBC
HCT: 40.8 % (ref 39.0–52.0)
Hemoglobin: 13.4 g/dL (ref 13.0–17.0)
MCH: 29.6 pg (ref 26.0–34.0)
MCHC: 32.8 g/dL (ref 30.0–36.0)
MCV: 90.1 fL (ref 80.0–100.0)
Platelets: 243 10*3/uL (ref 150–400)
RBC: 4.53 MIL/uL (ref 4.22–5.81)
RDW: 13.1 % (ref 11.5–15.5)
WBC: 9.2 10*3/uL (ref 4.0–10.5)
nRBC: 0 % (ref 0.0–0.2)

## 2019-06-23 LAB — GLUCOSE, CAPILLARY
Glucose-Capillary: 102 mg/dL — ABNORMAL HIGH (ref 70–99)
Glucose-Capillary: 115 mg/dL — ABNORMAL HIGH (ref 70–99)
Glucose-Capillary: 128 mg/dL — ABNORMAL HIGH (ref 70–99)
Glucose-Capillary: 162 mg/dL — ABNORMAL HIGH (ref 70–99)

## 2019-06-23 LAB — RESPIRATORY PANEL BY RT PCR (FLU A&B, COVID)
Influenza A by PCR: NEGATIVE
Influenza B by PCR: NEGATIVE
SARS Coronavirus 2 by RT PCR: NEGATIVE

## 2019-06-23 LAB — PROTIME-INR
INR: 1 (ref 0.8–1.2)
Prothrombin Time: 13.5 seconds (ref 11.4–15.2)

## 2019-06-23 LAB — APTT: aPTT: 24 seconds (ref 24–36)

## 2019-06-23 MED ORDER — SODIUM CHLORIDE 0.9 % IV SOLN
50.0000 mL | Freq: Once | INTRAVENOUS | Status: AC
  Administered 2019-06-23: 50 mL via INTRAVENOUS

## 2019-06-23 MED ORDER — INSULIN ASPART 100 UNIT/ML ~~LOC~~ SOLN: 0.0000 [IU] | Freq: Every day | SUBCUTANEOUS | Status: DC

## 2019-06-23 MED ORDER — LABETALOL HCL 5 MG/ML IV SOLN: 10.0000 mg | INTRAVENOUS | Status: DC | PRN

## 2019-06-23 MED ORDER — SODIUM CHLORIDE 0.9% FLUSH: 3.0000 mL | Freq: Once | INTRAVENOUS | Status: DC

## 2019-06-23 MED ORDER — CLEVIDIPINE BUTYRATE 0.5 MG/ML IV EMUL
INTRAVENOUS | Status: AC
  Filled 2019-06-23: qty 50

## 2019-06-23 MED ORDER — LABETALOL HCL 5 MG/ML IV SOLN
INTRAVENOUS | Status: AC
  Filled 2019-06-23: qty 4

## 2019-06-23 MED ORDER — ONDANSETRON HCL 4 MG/2ML IJ SOLN
INTRAMUSCULAR | Status: AC
  Filled 2019-06-23: qty 2

## 2019-06-23 MED ORDER — ALTEPLASE (STROKE) FULL DOSE INFUSION
0.9000 mg/kg | Freq: Once | INTRAVENOUS | Status: AC
  Administered 2019-06-23: 78.1 mg via INTRAVENOUS
  Filled 2019-06-23: qty 100

## 2019-06-23 MED ORDER — ACETAMINOPHEN 160 MG/5ML PO SOLN: 650.0000 mg | ORAL | Status: DC | PRN

## 2019-06-23 MED ORDER — ACETAMINOPHEN 650 MG RE SUPP: 650.0000 mg | RECTAL | Status: DC | PRN

## 2019-06-23 MED ORDER — LABETALOL HCL 5 MG/ML IV SOLN
INTRAVENOUS | Status: AC | PRN
  Administered 2019-06-23 (×2): 10 mg via INTRAVENOUS

## 2019-06-23 MED ORDER — SENNOSIDES-DOCUSATE SODIUM 8.6-50 MG PO TABS: 1.0000 | ORAL_TABLET | Freq: Every evening | ORAL | Status: DC | PRN

## 2019-06-23 MED ORDER — CLEVIDIPINE BUTYRATE 0.5 MG/ML IV EMUL
INTRAVENOUS | Status: AC | PRN
  Administered 2019-06-23: 1 mg/h via INTRAVENOUS

## 2019-06-23 MED ORDER — ACETAMINOPHEN 325 MG PO TABS: 650.0000 mg | ORAL_TABLET | ORAL | Status: DC | PRN

## 2019-06-23 MED ORDER — INSULIN ASPART 100 UNIT/ML ~~LOC~~ SOLN
0.0000 [IU] | Freq: Three times a day (TID) | SUBCUTANEOUS | Status: DC
  Administered 2019-06-23 – 2019-06-24 (×2): 2 [IU] via SUBCUTANEOUS
  Administered 2019-06-24: 3 [IU] via SUBCUTANEOUS

## 2019-06-23 MED ORDER — STROKE: EARLY STAGES OF RECOVERY BOOK
Freq: Once | Status: AC
  Filled 2019-06-23: qty 1

## 2019-06-23 MED ORDER — IOHEXOL 350 MG/ML SOLN
75.0000 mL | Freq: Once | INTRAVENOUS | Status: AC | PRN
  Administered 2019-06-23: 75 mL via INTRAVENOUS

## 2019-06-23 NOTE — Progress Notes (Signed)
Pharmacist Code Stroke Response  Notified to mix tPA at 1144 by Dr. Wilford Corner Delivered tPA to RN at 1148  tPA dose = 7.8mg  bolus over 1 minute followed by 70.3mg  for a total dose of 78.1mg  over 1 hour  Issues/delays encountered (if applicable): BP required treatment with labetalol and clevidipine  Joseph Arellano, [name] 06/23/19 11:57 AM

## 2019-06-23 NOTE — Plan of Care (Signed)
  Problem: Education: Goal: Knowledge of patient specific risk factors addressed and post discharge goals established will improve Outcome: Progressing   Problem: Education: Goal: Knowledge of secondary prevention will improve Outcome: Progressing   Problem: Education: Goal: Individualized Educational Video(s) Outcome: Progressing

## 2019-06-23 NOTE — ED Provider Notes (Signed)
[facility] EMERGENCY DEPARTMENT Provider Note   CSN: [id number] Arrival date & time: 06/23/19  1130     History Chief Complaint  Patient presents with  . Code Stroke    Joseph Arellano is a 86 y.o. male.  86 year old male with prior medical history as detailed below presents for evaluation of acute onset slurred speech and weakness.  Onset of symptoms was at 9:00 this morning.  Right-sided weakness was noted by family as well.  Patient transported to the ED as a code stroke.  He was met at on arrival by the neuro team.    Upon my initial evaluation the patient has already been given the initial dose of TPA.  He appears to be comfortable.  The history is provided by the patient, medical records and the EMS personnel.  Illness Location:  Acute onset dysphasia and weakness Severity:  Severe Onset quality:  Sudden Duration:  1 hour Timing:  Rare Progression:  Unchanged Chronicity:  New      Past Medical History:  Diagnosis Date  . B12 deficiency   . Depression   . Diabetes   . GERD (gastroesophageal reflux disease)   . Hypercholesteremia     Patient Active Problem List   Diagnosis Date Noted  . Stroke ([place]) 06/23/2019  . Diabetic peripheral neuropathy ([place]) 01/09/2014      No family history on file.  Social History   Tobacco Use  . Smoking status: Former Smoker  Substance Use Topics  . Alcohol use: Yes    Comment: glass of wine every night (sometimes 2 max)  . Drug use: No    Home Medications Prior to Admission medications   Medication Sig Start Date End Date Taking? Authorizing Provider  citalopram (CELEXA) 40 MG tablet Take 40 mg by mouth daily.    [provider]  colestipol (COLESTID) 1 G tablet Take 1 g by mouth 2 (two) times daily.    [provider]  Cyanocobalamin (VITAMIN B-12 IJ) Inject as directed every 30 (thirty) days.    [provider]  FIBER PO Take by mouth.    [provider]    glyBURIDE micronized (GLYNASE) 3 MG tablet Take 3 mg by mouth daily with breakfast.    [provider]  magnesium oxide (MAG-OX) 400 MG tablet Take 400 mg by mouth daily.    [provider]  metFORMIN (GLUCOPHAGE) 1000 MG tablet Take 1,000 mg by mouth daily.     [provider]  Multiple Vitamin (MULTIVITAMIN) tablet Take 1 tablet by mouth daily.    [provider]  Omega-3 Fatty Acids (FISH OIL PO) Take by mouth.    [provider]  omeprazole (PRILOSEC) 20 MG capsule Take 20 mg by mouth daily.    [provider]  Probiotic Product (PROBIOTIC PO) Take by mouth.    [provider]  vitamin B-12 (CYANOCOBALAMIN) 1000 MCG tablet Take 1,000 mcg by mouth daily.    [provider]    Allergies    Patient has no known allergies.  Review of Systems   Review of Systems  Unable to perform ROS: Acuity of condition    Physical Exam Updated Vital Signs Wt 86.8 kg   BMI 27.46 kg/m   Physical Exam Vitals and nursing note reviewed.  Constitutional:      General: He is not in acute distress.    Appearance: He is well-developed.  HENT:     Head: Normocephalic and atraumatic.  Eyes:  Conjunctiva/sclera: Conjunctivae normal.     Pupils: Pupils are equal, round, and reactive to light.  Cardiovascular:     Rate and Rhythm: Normal rate and regular rhythm.     Heart sounds: Normal heart sounds.  Pulmonary:     Effort: Pulmonary effort is normal. No respiratory distress.     Breath sounds: Normal breath sounds.  Abdominal:     General: There is no distension.     Palpations: Abdomen is soft.     Tenderness: There is no abdominal tenderness.  Musculoskeletal:        General: No deformity. Normal range of motion.     Cervical back: Normal range of motion and neck supple.  Skin:    General: Skin is warm and dry.  Neurological:     Mental Status: He is alert and oriented to person, place, and time.     Comments:  Alert  Dysphasia present   Right arm and leg weakness      ED Results / Procedures / Treatments   Labs (all labs ordered are listed, but only abnormal results are displayed) Labs Reviewed  I-STAT CHEM 8, ED - Abnormal; Notable for the following components:      Result Value   Glucose, Bld 157 (*)    Calcium, Ion 1.05 (*)    All other components within normal limits  CBG MONITORING, ED - Abnormal; Notable for the following components:   Glucose-Capillary 144 (*)    All other components within normal limits  PROTIME-INR  APTT  CBC  DIFFERENTIAL  COMPREHENSIVE METABOLIC PANEL  HEMOGLOBIN A1C    EKG EKG Interpretation  Date/Time:  Monday June 23 2019 12:13:26 EST Ventricular Rate:  73 PR Interval:    QRS Duration: 161 QT Interval:  455 QTC Calculation: 502 R Axis:   -78 Text Interpretation: Sinus rhythm Prolonged PR interval RBBB and LAFB Probable left ventricular hypertrophy Confirmed by Dene Gentry [id number]) on 06/23/2019 12:14:59 PM   Radiology CT HEAD CODE STROKE WO CONTRAST  Result Date: 06/23/2019 CLINICAL DATA:  Code stroke. 86 year old male last seen normal at 0 900 hours. Right side weakness and slurred speech. EXAM: CT HEAD WITHOUT CONTRAST TECHNIQUE: Contiguous axial images were obtained from the base of the skull through the vertex without intravenous contrast. COMPARISON:  None. FINDINGS: Brain: No acute intracranial hemorrhage identified. No midline shift, mass effect, or evidence of intracranial mass lesion. No ventriculomegaly. Patchy and confluent bilateral cerebral white matter hypodensity. No cortically based acute infarct identified. The deep gray nuclei and posterior fossa remain within normal limits. Vascular: Calcified atherosclerosis at the skull base. No suspicious intracranial vascular hyperdensity. Skull: No acute osseous abnormality identified. Sinuses/Orbits: Bubbly opacity in the left sphenoid sinus, other paranasal sinuses and mastoids are  clear. Other: No acute orbit or scalp soft tissue finding. ASPECTS [facility] Stroke Program Early CT Score) Total score (0-10 with 10 being normal): 10 IMPRESSION: 1. No acute cortically based infarct or acute intracranial hemorrhage identified. ASPECTS 10. 2. Symmetric appearing cerebral white matter changes most commonly due to chronic small vessel disease. 3. These results were communicated to Dr. Lorraine Lax at 11:48 am on 06/23/2019 by text page via the [facility] messaging system. Electronically Signed   By: Genevie Ann M.D.   On: 06/23/2019 11:49    Procedures Procedures (including critical care time) CRITICAL CARE Performed by: Valarie Merino   Total critical care time: 20 minutes  Critical care time was exclusive of separately billable procedures and treating other patients.  Critical care was necessary to treat or prevent imminent or life-threatening deterioration.  Critical care was time spent personally by me on the following activities: development of treatment plan with patient and/or surrogate as well as nursing, discussions with consultants, evaluation of patient's response to treatment, examination of patient, obtaining history from patient or surrogate, ordering and performing treatments and interventions, ordering and review of laboratory studies, ordering and review of radiographic studies, pulse oximetry and re-evaluation of patient's condition.   Medications Ordered in ED Medications  sodium chloride flush (NS) 0.9 % injection 3 mL (has no administration in time range)  ondansetron (ZOFRAN) 4 MG/2ML injection (has no administration in time range)  ondansetron (ZOFRAN) 4 MG/2ML injection (has no administration in time range)  alteplase (ACTIVASE) 1 mg/mL infusion 78.1 mg (78.1 mg Intravenous New Bag/Given 06/23/19 1156)    Followed by  0.9 %  sodium chloride infusion (has no administration in time range)  clevidipine (CLEVIPREX) 0.5 MG/ML infusion (has no administration in time range)   labetalol (NORMODYNE) 5 MG/ML injection (has no administration in time range)  insulin aspart (novoLOG) injection 0-9 Units (has no administration in time range)  insulin aspart (novoLOG) injection 0-5 Units (has no administration in time range)   stroke: mapping our early stages of recovery book (has no administration in time range)  acetaminophen (TYLENOL) tablet 650 mg (has no administration in time range)    Or  acetaminophen (TYLENOL) 160 MG/5ML solution 650 mg (has no administration in time range)    Or  acetaminophen (TYLENOL) suppository 650 mg (has no administration in time range)  senna-docusate (Senokot-S) tablet 1 tablet (has no administration in time range)  iohexol (OMNIPAQUE) 350 MG/ML injection 75 mL (75 mLs Intravenous Contrast Given 06/23/19 1202)    ED Course  I have reviewed the triage vital signs and the nursing notes.  Pertinent labs & imaging results that were available during my care of the patient were reviewed by me and considered in my medical decision making (see chart for details).    MDM Rules/Calculators/A&P                      MDM  Screen complete  Joseph Arellano was evaluated in Emergency Department on 06/23/2019 for the symptoms described in the history of present illness. He was evaluated in the context of the global COVID-19 pandemic, which necessitated consideration that the patient might be at risk for infection with the SARS-CoV-2 virus that causes COVID-19. Institutional protocols and algorithms that pertain to the evaluation of patients at risk for COVID-19 are in a state of rapid change based on information released by regulatory bodies including the CDC and federal and state organizations. These policies and algorithms were followed during the patient's care in the ED.  Patient is presenting for suspected CVA.  He meets criteria for TPA.  TPA administered in the ED.  Patient will be admitted to the neuro service for further treatment and  evaluation.   Final Clinical Impression(s) / ED Diagnoses Final diagnoses:  Cerebrovascular accident (CVA), unspecified mechanism ([place])    Rx / DC Orders ED Discharge Orders    None       Valarie Merino, MD 06/23/19 1229

## 2019-06-23 NOTE — Progress Notes (Signed)
PT Cancellation Note  Patient Details Name: Joseph Arellano MRN: [id number] DOB: 30-Nov-1929   Cancelled Treatment:    Reason Eval/Treat Not Completed: Patient not medically ready, MRI pending. Will evaluated when medically appropriate.     [name] 06/23/2019, 3:29 PM

## 2019-06-23 NOTE — H&P (Addendum)
Neurology history and physical   CC: Dysarthria and right-sided weakness  History is obtained from: EMS/patient  HPI: Joseph Arellano is a 86 y.o. male with history of hypercholesterolemia, diabetes, depression.  Patient states that he awoke at approximately 8:30 AM this morning.  He at that point went to the bathroom at 9 AM to shave when he suddenly noticed that his legs buckled underneath him.  He had that time notes he had right-sided weakness.  This got worse as he went to his kitchen to the point where he fell and did not get back up.  At that time he did call his daughter at approximately 10 AM who called EMS.  Patient at that point was brought to [facility] by EMS as a code stroke.  On arrival patient continues to show right facial droop, dysarthria, ataxia and drift on the right.  Immediate CT of head along with CTA of head was obtained.  While on CT table patient did have 1 episode of emesis.  Blood pressure needed to be lowered prior to TPA administration, however TPA was administered at 11:56 AM.  Of note-approximately 10 to 20 minutes later patient NIH stroke scale improved and was a 2  ED course  Relevant labs include -no abnormal labs were noted CT head shows-no acute cortically based infarct or acute intracranial hemorrhage identified.  Symmetrical appearing cerebral white matter changes most commonly due to chronic small vessel disease CTA head neck-no large vessel occlusion, intracranial atherosclerosis including severe proximal left PCA and left A2 stenosis.  Patent vertebral arteries with mild proximal stenosis on the right  Chart review back in 2015 patient did see Dr. Arbutus Leas for abnormal gait.  At that time gait instability was thought to be secondary to his peripheral neuropathy.  LKW: 8:30 AM tpa given?:  Yes at 11:56 AM Premorbid modified Rankin scale (mRS): 0 NIH stroke score of 6  Of note-approximately 10 to 20 minutes later patient NIH stroke scale improved and was a  2  Past Medical History:  Diagnosis Date  . B12 deficiency   . Depression   . Diabetes (HCC)   . GERD (gastroesophageal reflux disease)   . Hypercholesteremia   . Peripheral neuropathy     Family History  Problem Relation Age of Onset  . Hypertension Father   . Hypertension Mother    Social History:   reports that he has quit smoking. He does not have any smokeless tobacco history on file. He reports current alcohol use. He reports that he does not use drugs.  Medications  Current Facility-Administered Medications:  .   stroke: mapping our early stages of recovery book, , Does not apply, Once, Amariah Kierstead, Georgiana Spinner R, MD .  acetaminophen (TYLENOL) tablet 650 mg, 650 mg, Oral, Q4H PRN **OR** acetaminophen (TYLENOL) 160 MG/5ML solution 650 mg, 650 mg, Per Tube, Q4H PRN **OR** acetaminophen (TYLENOL) suppository 650 mg, 650 mg, Rectal, Q4H PRN, Coree Riester, Georgiana Spinner R, MD .  clevidipine (CLEVIPREX) 0.5 MG/ML infusion, , , ,  .  insulin aspart (novoLOG) injection 0-5 Units, 0-5 Units, Subcutaneous, QHS, Zaidin Blyden, Georgiana Spinner R, MD .  insulin aspart (novoLOG) injection 0-9 Units, 0-9 Units, Subcutaneous, TID WC, Adjoa Althouse, Georgiana Spinner R, MD .  labetalol (NORMODYNE) 5 MG/ML injection, , , ,  .  ondansetron (ZOFRAN) 4 MG/2ML injection, , , ,  .  ondansetron (ZOFRAN) 4 MG/2ML injection, , , ,  .  senna-docusate (Senokot-S) tablet 1 tablet, 1 tablet, Oral, QHS PRN, Veronique Warga, Dara Lords, MD .  sodium chloride flush (NS) 0.9 % injection 3 mL, 3 mL, Intravenous, Once, Messick, Noralyn Pick, MD  Current Outpatient Medications:  .  citalopram (CELEXA) 40 MG tablet, Take 40 mg by mouth daily., Disp: , Rfl:  .  colestipol (COLESTID) 1 G tablet, Take 1 g by mouth 2 (two) times daily., Disp: , Rfl:  .  Cyanocobalamin (VITAMIN B-12 IJ), Inject as directed every 30 (thirty) days., Disp: , Rfl:  .  FIBER PO, Take by mouth., Disp: , Rfl:  .  glyBURIDE micronized (GLYNASE) 3 MG tablet, Take 3 mg by mouth daily with breakfast., Disp:  , Rfl:  .  magnesium oxide (MAG-OX) 400 MG tablet, Take 400 mg by mouth daily., Disp: , Rfl:  .  metFORMIN (GLUCOPHAGE) 1000 MG tablet, Take 1,000 mg by mouth daily. , Disp: , Rfl:  .  Multiple Vitamin (MULTIVITAMIN) tablet, Take 1 tablet by mouth daily., Disp: , Rfl:  .  Omega-3 Fatty Acids (FISH OIL PO), Take by mouth., Disp: , Rfl:  .  omeprazole (PRILOSEC) 20 MG capsule, Take 20 mg by mouth daily., Disp: , Rfl:  .  Probiotic Product (PROBIOTIC PO), Take by mouth., Disp: , Rfl:  .  vitamin B-12 (CYANOCOBALAMIN) 1000 MCG tablet, Take 1,000 mcg by mouth daily., Disp: , Rfl:   ROS: Unable to obtain due to altered mental status.   General ROS: negative for - chills, fatigue, fever, night sweats, weight gain or weight loss Psychological ROS: negative for - behavioral disorder, hallucinations, memory difficulties, mood swings or suicidal ideation Ophthalmic ROS: negative for - blurry vision, double vision, eye pain or loss of vision ENT ROS: negative for - epistaxis, nasal discharge, oral lesions, sore throat, tinnitus or vertigo Respiratory ROS: negative for - cough, hemoptysis, shortness of breath or wheezing Cardiovascular ROS: negative for - chest pain, dyspnea on exertion, edema or irregular heartbeat Gastrointestinal ROS: Positive for -  nausea/vomiting Genito-Urinary ROS: negative for - dysuria, hematuria, incontinence or urinary frequency/urgency Musculoskeletal ROS: Positive for - muscular weakness Neurological ROS: as noted in HPI Dermatological ROS: negative for rash and skin lesion changes  Exam: Current vital signs: BP (!) 159/88   Pulse 77   Temp 97.7 F (36.5 C) (Oral)   Resp 15   Wt 86.8 kg   SpO2 99%   BMI 27.46 kg/m  Vital signs in last 24 hours: Temp:  [97.7 F (36.5 C)] 97.7 F (36.5 C) (02/08 1150) Pulse Rate:  [72-81] 77 (02/08 1335) Resp:  [13-25] 15 (02/08 1335) BP: (140-185)/(66-112) 159/88 (02/08 1335) SpO2:  [95 %-99 %] 99 % (02/08 1335) Weight:   [86.8 kg] 86.8 kg (02/08 1100)   Constitutional: Appears well-developed and well-nourished.  Eyes: No scleral injection HENT: No OP obstrucion Head: Normocephalic.  Cardiovascular: Normal rate and regular rhythm.  Respiratory: Effort normal, non-labored breathing GI: Soft.  No distension. There is no tenderness.  Skin: WDI  Neuro: Mental Status: Patient is awake, alert, oriented to person, place, month, year, Speech-no issues with naming, repeating, comprehension Positive dysarthria Cranial Nerves: II: Visual Fields are full.  III,IV, VI: EOMI without ptosis or diploplia. Pupils equal, round and reactive to light V: Facial sensation is symmetric to temperature VII: Slight right facial droop VIII: hearing is intact to voice X: Palat elevates symmetrically XI: Shoulder shrug is symmetric. XII: tongue is midline without atrophy or fasciculations.  Motor: Tone is normal. Bulk is normal. 5/5 strength was present in all four extremities.  Positive drift on right leg Sensory: Sensation  is symmetric to light touch and temperature in the arms and legs. Intact DSS Deep Tendon Reflexes: 2+ and symmetric in the biceps. Plantars: Mute Cerebellar: Positive ataxia in both right arm and right leg  Labs I have reviewed labs in epic and the results pertinent to this consultation are:  CBC    Component Value Date/Time   WBC 9.2 06/23/2019 1134   RBC 4.53 06/23/2019 1134   HGB 13.3 06/23/2019 1137   HCT 39.0 06/23/2019 1137   PLT 243 06/23/2019 1134   MCV 90.1 06/23/2019 1134   MCH 29.6 06/23/2019 1134   MCHC 32.8 06/23/2019 1134   RDW 13.1 06/23/2019 1134   LYMPHSABS 2.3 06/23/2019 1134   MONOABS 0.4 06/23/2019 1134   EOSABS 0.1 06/23/2019 1134   BASOSABS 0.1 06/23/2019 1134    CMP     Component Value Date/Time   NA 140 06/23/2019 1137   K 4.2 06/23/2019 1137   CL 104 06/23/2019 1137   CO2 24 06/23/2019 1134   GLUCOSE 157 (H) 06/23/2019 1137   BUN 14 06/23/2019 1137    CREATININE 1.10 06/23/2019 1137   CALCIUM 8.5 (L) 06/23/2019 1134   PROT 7.0 06/23/2019 1134   ALBUMIN 3.7 06/23/2019 1134   AST 29 06/23/2019 1134   ALT 25 06/23/2019 1134   ALKPHOS 70 06/23/2019 1134   BILITOT 0.9 06/23/2019 1134   GFRNONAA 56 (L) 06/23/2019 1134   GFRAA >60 06/23/2019 1134     Imaging I have reviewed the images obtained:  CT head shows-no acute cortically based infarct or acute intracranial hemorrhage identified.  Symmetrical appearing cerebral white matter changes most commonly due to chronic small vessel disease CTA head neck-no large vessel occlusion, intracranial atherosclerosis including severe proximal left PCA and left A2 stenosis.  Patent vertebral arteries with mild proximal stenosis on the right   Etta Quill PA-C [facility] [phone]  M-F  (9:00 am- 5:00 PM)  06/23/2019, 1:41 PM     Assessment:  86 year old male presenting to the hospital with acute onset of right-sided weakness, dysarthria, fall.  CT head showed no acute infarct and CTA head and neck did not show any large vessel occlusion.  Patient was within the window and TPA was administered.  Post TPA patient did improve with right-sided weakness.  As noted above initial exam did show right facial droop, dysarthria, and right-sided weakness with right leg drift.  Patient had no significant adverse effects status post TPA administration.  Most likely this represents a small vessel infarct either in the internal capsule and or pons.  Plan:  Acute Ischemic Stroke  Acuity: Acute Current Suspected Etiology: Small vessel infarct -Admit to: 3 [facility]. -Continue Statin -Hold Aspirin until 24 hour post tPA neuroimaging is stable and without evidence of bleeding -Blood pressure control, goal of SYS <180/105 mmHG, PRN labetalol ordered -MRI/ECHO/A1C/Lipid panel. -Hyperglycemia management per SSI to maintain glucose 140-180mg /dL. -PT/OT/ST therapies and recommendations when  able  CNS -Close neuro monitoring  Dysarthria -NPO until cleared by speech -ST   Hemiplegia and hemiparesis following cerebral infarction affecting right dominant side  RESP No active issues  CV No active issues Maintain systolic blood pressure less than 180  Hyperlipidemia, unspecified  - Statin for goal LDL < 70   HEME No active issues -Monitor -transfuse for hgb < 7  Coagulopathy secondary to anticoagulation No active issues  ENDO Type 2 diabetes mellitus w/o complications. -SSI -goal HgbA1c < 7  GI/GU ESRD No active issues  Fluid/Electrolyte Disorders Gently hydrate  Nutrition-heart healthy diet once passed by speech therapy  Prophylaxis DVT: SCDs GI: Protonix Bowel: Senokot  Diet: NPO until cleared by speech  Code Status: Full Code     NEUROHOSPITALIST ADDENDUM Performed a face to face diagnostic evaluation.   I have reviewed the contents of history and physical exam as documented by PA/ARNP/Resident and agree with above documentation.  I have discussed and formulated the above plan as documented. Edits to the note have been made as needed.  Patient with h/o insulin-dependent diabetes mellitus, hyperlipidemia presents to the emergency department after sudden onset ataxia, right-sided weakness and clumsiness that began around 9:00.  Patient states he was his normal self when he got up at 8:30 in the morning.  When he was about to shave he noticed that he had some difficulty using his right side and subsequently had gait imbalance which progressively got worse.  Patient had a fall in the kitchen around 10 AM called his daughter who then called EMS . On arrival to [facility], ER patient noted to have right facial droop, slurred speech, right arm and right leg drift with significant ataxia.  Ataxia was out of proportion to weakness.  NIH stroke scale was 7.  Patient received IV TPA, has clinically improved after administration.  CTA does not show any  large vessel occlusion does show  left PCA stenosis which may be relevant. MRI brain pending, suspect etiology of stroke to be small vessel disease however could also be related to intracranial atherosclerotic disease.  Less likely cardioembolic, MRI brain will be helpful in assessing this further.    This patient is neurologically critically ill due to stroke s/p TPA.  He is at risk for significant risk of neurological worsening from cerebral edema,  death from brain herniation, heart failure, hemorrhagic conversion, infection, respiratory failure and seizure. This patient's care requires constant monitoring of vital signs, hemodynamics, respiratory and cardiac monitoring, review of multiple databases, neurological assessment, discussion with family, other specialists and medical decision making of high complexity.  I spent 55 minutes of neurocritical time in the care of this patient.        Georgiana Spinner Deronda Christian MD [facility] [phone]   If 7pm to 7am, please call on call as listed on AMION.

## 2019-06-23 NOTE — ED Triage Notes (Signed)
Pt BIB [facility] as a code stroke. EMS reports that patient developed sudden onset weakness on his right side, slurred speech at 0900 this morning. Per EMS patient called his daughter who called EMS. Pt takes no blood thinners. Initial NIH of 6. Pt is alert and oriented upon ED arrival. HTN for EMS at 181 systolic.

## 2019-06-23 NOTE — Code Documentation (Signed)
86 yo male coming from home with complaints of sudden onset of right sided weakness. Pt lives by himself and was shaving this morning when he had a sudden onset of right sided facial droop, right arm weakness, and right leg weakness at 0900. Pt called daughter and daughter called EMS who activated a Code Stroke. Stroke Team met pateint upon arrival to the ED. Labs drawn and EDP cleared airway. Pt taken to CT. CT showed no hemorrhage. Initial NIHSS 6 due to right arm and leg drift, ataxia, right facial droop, and dysarthria. Pt IV access infiltrated and unable to use for meds or CTA. IV access obtained. BP 215/103 10 mg of labetalol given at 1147. Second IV obtained. Repeated BP 140/112. Retake 185/72. tPA started at 1156. CTA completed. BP noted to be 180/71. Second dose of 10 mg of Labetalol given. Taken back to the room and placed on cardiac monitor. Post-tPA assessments started per protocol. Pt to be admitted to [facility]. BPs within range. Handoff given to [place], [place].

## 2019-06-23 NOTE — ED Notes (Signed)
3[facility] unable to take report at this time.  

## 2019-06-23 NOTE — ED Notes (Signed)
Cleviprex stopped at 1215.

## 2019-06-23 NOTE — Progress Notes (Signed)
Interim Note  Patient failed bedside swallow in the emergency department, patient has improved subsequently.  He is on the floor and nurse requested me to perform swallow eval. Patient was able to swallow to sips of water without any difficulty.  Will start patient on soft diet, can be advanced tomorrow.

## 2019-06-23 NOTE — ED Provider Notes (Signed)
MSE was initiated and I personally evaluated the patient and placed orders (if any) at  11:32 AM on June 23, 2019.  The patient appears stable so that the remainder of the MSE may be completed by another provider.  Patient was a code stroke screened at the bridge and seems stable for CT scan   Benjiman Core, MD 06/23/19 1132

## 2019-06-24 ENCOUNTER — Inpatient Hospital Stay (HOSPITAL_COMMUNITY): Payer: Medicare HMO

## 2019-06-24 ENCOUNTER — Encounter (HOSPITAL_COMMUNITY): Payer: Self-pay | Admitting: Neurology

## 2019-06-24 ENCOUNTER — Other Ambulatory Visit: Payer: Self-pay | Admitting: Physician Assistant

## 2019-06-24 DIAGNOSIS — E1159 Type 2 diabetes mellitus with other circulatory complications: Secondary | ICD-10-CM

## 2019-06-24 DIAGNOSIS — E7800 Pure hypercholesterolemia, unspecified: Secondary | ICD-10-CM

## 2019-06-24 DIAGNOSIS — F329 Major depressive disorder, single episode, unspecified: Secondary | ICD-10-CM | POA: Diagnosis present

## 2019-06-24 DIAGNOSIS — E538 Deficiency of other specified B group vitamins: Secondary | ICD-10-CM | POA: Diagnosis present

## 2019-06-24 DIAGNOSIS — Z9282 Status post administration of tPA (rtPA) in a different facility within the last 24 hours prior to admission to current facility: Secondary | ICD-10-CM

## 2019-06-24 DIAGNOSIS — I10 Essential (primary) hypertension: Secondary | ICD-10-CM

## 2019-06-24 DIAGNOSIS — I358 Other nonrheumatic aortic valve disorders: Secondary | ICD-10-CM

## 2019-06-24 DIAGNOSIS — I639 Cerebral infarction, unspecified: Secondary | ICD-10-CM

## 2019-06-24 DIAGNOSIS — G459 Transient cerebral ischemic attack, unspecified: Principal | ICD-10-CM

## 2019-06-24 DIAGNOSIS — F32A Depression, unspecified: Secondary | ICD-10-CM | POA: Diagnosis present

## 2019-06-24 DIAGNOSIS — E785 Hyperlipidemia, unspecified: Secondary | ICD-10-CM | POA: Diagnosis present

## 2019-06-24 DIAGNOSIS — K219 Gastro-esophageal reflux disease without esophagitis: Secondary | ICD-10-CM | POA: Diagnosis present

## 2019-06-24 DIAGNOSIS — E119 Type 2 diabetes mellitus without complications: Secondary | ICD-10-CM

## 2019-06-24 LAB — ECHOCARDIOGRAM COMPLETE
Height: 70 in
Weight: 3061.75 oz

## 2019-06-24 LAB — LIPID PANEL
Cholesterol: 175 mg/dL (ref 0–200)
HDL: 45 mg/dL (ref >40)
LDL Cholesterol: 114 mg/dL — ABNORMAL HIGH (ref 0–99)
Total CHOL/HDL Ratio: 3.9 RATIO
Triglycerides: 78 mg/dL (ref <150)
VLDL: 16 mg/dL (ref 0–40)

## 2019-06-24 LAB — HEMOGLOBIN A1C
Hgb A1c MFr Bld: 6.9 % — ABNORMAL HIGH (ref 4.8–5.6)
Mean Plasma Glucose: 151 mg/dL

## 2019-06-24 LAB — GLUCOSE, CAPILLARY
Glucose-Capillary: 108 mg/dL — ABNORMAL HIGH (ref 70–99)
Glucose-Capillary: 163 mg/dL — ABNORMAL HIGH (ref 70–99)
Glucose-Capillary: 224 mg/dL — ABNORMAL HIGH (ref 70–99)

## 2019-06-24 MED ORDER — MAGNESIUM OXIDE 400 (241.3 MG) MG PO TABS
400.0000 mg | ORAL_TABLET | Freq: Every day | ORAL | Status: DC
  Administered 2019-06-24: 400 mg via ORAL
  Filled 2019-06-24: qty 1

## 2019-06-24 MED ORDER — COLESTIPOL HCL 1 G PO TABS
1.0000 g | ORAL_TABLET | Freq: Two times a day (BID) | ORAL | Status: DC
  Administered 2019-06-24: 1 g via ORAL
  Filled 2019-06-24 (×2): qty 1

## 2019-06-24 MED ORDER — MAGNESIUM OXIDE 400 MG PO TABS
400.0000 mg | ORAL_TABLET | Freq: Every day | ORAL | Status: DC
  Filled 2019-06-24: qty 1

## 2019-06-24 MED ORDER — CLOPIDOGREL BISULFATE 75 MG PO TABS: 75.0000 mg | ORAL_TABLET | Freq: Every day | ORAL | 0 refills | Status: DC

## 2019-06-24 MED ORDER — CITALOPRAM HYDROBROMIDE 40 MG PO TABS
40.0000 mg | ORAL_TABLET | Freq: Every day | ORAL | Status: DC
  Administered 2019-06-24: 40 mg via ORAL
  Filled 2019-06-24: qty 1

## 2019-06-24 MED ORDER — VITAMIN B-12 1000 MCG PO TABS
1000.0000 ug | ORAL_TABLET | Freq: Every day | ORAL | Status: DC
  Administered 2019-06-24: 1000 ug via ORAL
  Filled 2019-06-24: qty 1

## 2019-06-24 MED ORDER — ATORVASTATIN CALCIUM 20 MG PO TABS: 20.0000 mg | ORAL_TABLET | Freq: Every day | ORAL | 2 refills | Status: AC

## 2019-06-24 MED ORDER — CLOPIDOGREL BISULFATE 75 MG PO TABS
75.0000 mg | ORAL_TABLET | Freq: Every day | ORAL | Status: DC
  Administered 2019-06-24: 75 mg via ORAL
  Filled 2019-06-24: qty 1

## 2019-06-24 MED ORDER — ATORVASTATIN CALCIUM 10 MG PO TABS: 20.0000 mg | ORAL_TABLET | Freq: Every day | ORAL | Status: DC

## 2019-06-24 MED ORDER — PANTOPRAZOLE SODIUM 40 MG PO TBEC
40.0000 mg | DELAYED_RELEASE_TABLET | Freq: Every day | ORAL | Status: DC
  Administered 2019-06-24: 40 mg via ORAL
  Filled 2019-06-24: qty 1

## 2019-06-24 MED ORDER — ASPIRIN EC 81 MG PO TBEC
81.0000 mg | DELAYED_RELEASE_TABLET | Freq: Every day | ORAL | Status: DC
  Administered 2019-06-24: 81 mg via ORAL
  Filled 2019-06-24: qty 1

## 2019-06-24 MED ORDER — ASPIRIN 81 MG PO TBEC: 81.0000 mg | DELAYED_RELEASE_TABLET | Freq: Every day | ORAL | Status: AC

## 2019-06-24 MED FILL — CLOPIDOGREL 75 MG TABLET: 75 | 21 days supply | Qty: 21 | Fill #0

## 2019-06-24 MED FILL — ATORVASTATIN CALCIUM 20 MG: 20 | 30 days supply | Qty: 30 | Fill #0

## 2019-06-24 NOTE — TOC Transition Note (Signed)
Transition of Care [facility]) - CM/SW Discharge Note   Patient Details  Name: Joseph Arellano MRN: [id number] Date of Birth: [birth year]  Transition of Care [facility] CM/SW Contact:  Kermit Balo, RN Phone Number: 06/24/2019, 1:21 PM   Clinical Narrative:    Pt discharging home with [facility] service through [place]. Cory with [facility] accepted the referral.  No DME needs.  Daughter to provide transport home.   Final next level of care: Home w Home Health Services Barriers to Discharge: No Barriers Identified   Patient Goals and CMS Choice   CMS Medicare.gov Compare Post Acute Care list provided to:: Patient Choice offered to / list presented to : Patient  Discharge Placement                       Discharge Plan and Services                          HH Arranged: PT [facility] Agency: [facility] Date [facility] Agency Contacted: 06/24/19   Representative spoke with at [facility] Agency: Kandee Keen  Social Determinants of Health (SDOH) Interventions     Readmission Risk Interventions No flowsheet data found.

## 2019-06-24 NOTE — Progress Notes (Signed)
  Echocardiogram 2D Echocardiogram has been performed.  Joseph Arellano A Granvil Djordjevic 06/24/2019, 11:52 AM

## 2019-06-24 NOTE — Progress Notes (Signed)
SLP Cancellation Note  Patient Details Name: NIKI COSMAN MRN: [id number] DOB: 12/14/1929   Cancelled treatment:       Reason Eval/Treat Not Completed: SLP screened, no needs identified, will sign off.    Bessie Livingood, Riley Nearing 06/24/2019, 9:40 AM

## 2019-06-24 NOTE — Progress Notes (Signed)
Occupational Therapy Evaluation Patient Details Name: Joseph Arellano MRN: [id number] DOB: 10-22-1929 Today's Date: 06/24/2019    History of Present Illness  Joseph Arellano is a 86 y.o. male with history of hypercholesterolemia, diabetes, depression. Presents with acute R sided weakness, received tPA and symptoms improved   Clinical Impression   PTA, pt was living at home alone, and reports he was independent with ADL/IADL and functional mobility. Pt currently demonstrates ability to complete ADL and independent to modified independent level. Educated pt on BE FAST signs and symptoms of a stroke with written handout. Education pt on importance of frequent exercise. Patient evaluated by Occupational Therapy with no further acute OT needs identified. All education has been completed and the patient has no further questions. See below for any follow-up Occupational Therapy or equipment needs. OT to sign off. Thank you for referral.     Follow Up Recommendations  No OT follow up    Equipment Recommendations  None recommended by OT    Recommendations for Other Services       Precautions / Restrictions Precautions Precautions: None Restrictions Weight Bearing Restrictions: No      Mobility Bed Mobility Overal bed mobility: Independent                Transfers Overall transfer level: Modified independent Equipment used: None             General transfer comment: stood safely without use of AD    Balance Overall balance assessment: Mild deficits observed, not formally tested                                         ADL either performed or assessed with clinical judgement   ADL Overall ADL's : Modified independent                                       General ADL Comments: pt demonstrated ability to complete ADL at modified independent to independent level     Vision Baseline Vision/History: Wears glasses Wears Glasses: Reading  only Patient Visual Report: No change from baseline       Perception     Praxis      Pertinent Vitals/Pain Pain Assessment: No/denies pain     Hand Dominance Right   Extremity/Trunk Assessment Upper Extremity Assessment Upper Extremity Assessment: Overall WFL for tasks assessed   Lower Extremity Assessment Lower Extremity Assessment: Defer to PT evaluation   Cervical / Trunk Assessment Cervical / Trunk Assessment: Normal   Communication Communication Communication: No difficulties   Cognition Arousal/Alertness: Awake/alert Behavior During Therapy: WFL for tasks assessed/performed Overall Cognitive Status: Within Functional Limits for tasks assessed                                 General Comments: very sharp 86 yo   General Comments  educated pt on BE FAST signs and symptoms of a stroke    Exercises     Shoulder Instructions      Home Living Family/patient expects to be discharged to:: Private residence Living Arrangements: Alone Available Help at Discharge: Family;Available PRN/intermittently Type of Home: House Home Access: Level entry     Home Layout: One level  Home Equipment: Gilmer Mor - single point   Additional Comments: daughters live nearby and check on him frequently      Prior Functioning/Environment Level of Independence: Independent        Comments: drives, cooks, cleans. Used to golf but admits to more sedentary lifestyle in recent yrs. Widower x10 yrs.         OT Problem List: Impaired balance (sitting and/or standing);Decreased knowledge of precautions      OT Treatment/Interventions:      OT Goals(Current goals can be found in the care plan section) Acute Rehab OT Goals Patient Stated Goal: return home OT Goal Formulation: With patient Time For Goal Achievement: 07/08/19 Potential to Achieve Goals: Good  OT Frequency:     Barriers to D/C:            Co-evaluation              AM-PAC  OT "6 Clicks" Daily Activity     Outcome Measure Help from another person eating meals?: None Help from another person taking care of personal grooming?: None Help from another person toileting, which includes using toliet, bedpan, or urinal?: None Help from another person bathing (including washing, rinsing, drying)?: None Help from another person to put on and taking off regular upper body clothing?: None Help from another person to put on and taking off regular lower body clothing?: None 6 Click Score: 24   End of Session Nurse Communication: Mobility status  Activity Tolerance: Patient tolerated treatment well Patient left: in bed;with call bell/phone within reach  OT Visit Diagnosis: Unsteadiness on feet (R26.81);Other abnormalities of gait and mobility (R26.89)                Time: 1610-9604 OT Time Calculation (min): 15 min Charges:  OT General Charges $OT Visit: 1 Visit OT Evaluation $OT Eval Low Complexity: 1 Low  Diona Browner OTR/L Acute Rehabilitation Services Office: [phone]   Rebeca Alert 06/24/2019, 1:41 PM

## 2019-06-24 NOTE — Evaluation (Signed)
Physical Therapy Evaluation Patient Details Name: NEIZAN DEBRUHL MRN: [id number] DOB: 03-01-1930 Today's Date: 06/24/2019   History of Present Illness   ALAND CHESTNUTT is a 86 y.o. male with history of hypercholesterolemia, diabetes, depression. Presents with acute R sided weakness, received tPA and symptoms improved  Clinical Impression  Patient evaluated by Physical Therapy with no further acute PT needs identified. All education has been completed and the patient has no further questions. Pt's symptoms have resolved at this point, no noted weakness RLE compared to L, pt able to ambulate 300' without AD and ascend 5 stairs with alternating pattern. Pt's gait began slow and cautious but was able to increase pace and stability as he kept going. Would benefit from home safety eval given his age and that he lives alone.  See below for any follow-up Physical Therapy or equipment needs. PT is signing off. Thank you for this referral.   VSS throughout eval   Follow Up Recommendations Home health PT(home safety eval)    Equipment Recommendations  None recommended by PT    Recommendations for Other Services       Precautions / Restrictions Precautions Precautions: None Restrictions Weight Bearing Restrictions: No      Mobility  Bed Mobility Overal bed mobility: Independent                Transfers Overall transfer level: Modified independent Equipment used: None             General transfer comment: stood safely without use of AD  Ambulation/Gait Ambulation/Gait assistance: Modified independent (Device/Increase time) Gait Distance (Feet): 300 Feet Assistive device: None Gait Pattern/deviations: Decreased stride length Gait velocity: WFL Gait velocity interpretation: >2.62 ft/sec, indicative of community ambulatory General Gait Details: pt began with decreased step length and mildly cautious gait but increased pace and confidence with distance   Stairs Stairs:  Yes Stairs assistance: Modified independent (Device/Increase time) Stair Management: One rail Right;Alternating pattern;Forwards Number of Stairs: 5 General stair comments: no difficulty with stairs or with alternating pattern  Wheelchair Mobility    Modified Rankin (Stroke Patients Only) Modified Rankin (Stroke Patients Only) Pre-Morbid Rankin Score: No symptoms Modified Rankin: No symptoms     Balance Overall balance assessment: Mild deficits observed, not formally tested                                           Pertinent Vitals/Pain Pain Assessment: No/denies pain    Home Living Family/patient expects to be discharged to:: Private residence Living Arrangements: Alone Available Help at Discharge: Family;Available PRN/intermittently Type of Home: House Home Access: Level entry     Home Layout: One level Home Equipment: Kasandra Knudsen - single point Additional Comments: daughters live nearby and check on him frequently    Prior Function Level of Independence: Independent         Comments: drives, cooks, cleans. Used to golf but admits to more sedentary lifestyle in recent yrs. Widower x10 yrs.      Hand Dominance   Dominant Hand: Right    Extremity/Trunk Assessment   Upper Extremity Assessment Upper Extremity Assessment: Defer to OT evaluation    Lower Extremity Assessment Lower Extremity Assessment: Overall WFL for tasks assessed    Cervical / Trunk Assessment Cervical / Trunk Assessment: Normal  Communication   Communication: No difficulties  Cognition Arousal/Alertness: Awake/alert Behavior During Therapy: WFL for tasks assessed/performed  Overall Cognitive Status: Within Functional Limits for tasks assessed                                 General Comments: very sharp 86 yo      General Comments General comments (skin integrity, edema, etc.): pt admits to decreased activity over recent years, would benefit from home  safety eval for balance and community activity to prevent readmission    Exercises     Assessment/Plan    PT Assessment All further PT needs can be met in the next venue of care  PT Problem List Decreased balance       PT Treatment Interventions      PT Goals (Current goals can be found in the Care Plan section)  Acute Rehab PT Goals Patient Stated Goal: return home PT Goal Formulation: All assessment and education complete, DC therapy    Frequency     Barriers to discharge        Co-evaluation               AM-PAC PT "6 Clicks" Mobility  Outcome Measure Help needed turning from your back to your side while in a flat bed without using bedrails?: None Help needed moving from lying on your back to sitting on the side of a flat bed without using bedrails?: None Help needed moving to and from a bed to a chair (including a wheelchair)?: None Help needed standing up from a chair using your arms (e.g., wheelchair or bedside chair)?: None Help needed to walk in hospital room?: None Help needed climbing 3-5 steps with a railing? : None 6 Click Score: 24    End of Session Equipment Utilized During Treatment: Gait belt Activity Tolerance: Patient tolerated treatment well Patient left: in bed;with call bell/phone within reach Nurse Communication: Mobility status PT Visit Diagnosis: Unsteadiness on feet (R26.81)    Time: 7510-2585 PT Time Calculation (min) (ACUTE ONLY): 24 min   Charges:   PT Evaluation $PT Eval Moderate Complexity: 1 Mod PT Treatments $Gait Training: 8-22 mins        Leighton Roach, PT  Acute Rehab Services  Pager [phone] Office [place] 06/24/2019, 1:20 PM

## 2019-06-24 NOTE — Discharge Summary (Signed)
Stroke Discharge Summary  Patient ID: Joseph Arellano      MRN: [id number]      DOB: 1930/04/05  Date of Admission: 06/23/2019 Date of Discharge: 06/24/2019  Attending Physician:  Marvel PlanXu, Tabitha Tupper, MD, Stroke MD Consultant(s):    None  Patient's PCP:  Adrian PrinceSouth, Stephen, MD  DISCHARGE DIAGNOSIS:  Principal Problem:   TIA s/p tPA  Active Problems:   Hyperlipidemia   Diabetes mellitus type II, controlled (HCC)   B12 deficiency   GERD (gastroesophageal reflux disease)   Depression   Allergies as of 06/24/2019   No Known Allergies     Medication List    TAKE these medications   aspirin 81 MG EC tablet Take 1 tablet (81 mg total) by mouth daily. Start taking on: June 25, 2019   atorvastatin 20 MG tablet Commonly known as: LIPITOR Take 1 tablet (20 mg total) by mouth daily at 6 PM.   citalopram 40 MG tablet Commonly known as: CELEXA Take 40 mg by mouth daily.   clopidogrel 75 MG tablet Commonly known as: PLAVIX Take 1 tablet (75 mg total) by mouth daily. Take with aspirin x 3 weeks then stop Plavix and continue aspirin alone Start taking on: June 25, 2019   colestipol 1 g tablet Commonly known as: COLESTID Take 1 g by mouth 2 (two) times daily.   FIBER PO Take 1 tablet by mouth daily.   FISH OIL PO Take 1 tablet by mouth daily.   magnesium oxide 400 MG tablet Commonly known as: MAG-OX Take 400 mg by mouth daily.   omeprazole 20 MG capsule Commonly known as: PRILOSEC Take 20 mg by mouth daily.   PROBIOTIC PO Take 1 tablet by mouth daily.   vitamin B-12 1000 MCG tablet Commonly known as: CYANOCOBALAMIN Take 1,000 mcg by mouth daily.       LABORATORY STUDIES CBC    Component Value Date/Time   WBC 9.2 06/23/2019 1134   RBC 4.53 06/23/2019 1134   HGB 13.3 06/23/2019 1137   HCT 39.0 06/23/2019 1137   PLT 243 06/23/2019 1134   MCV 90.1 06/23/2019 1134   MCH 29.6 06/23/2019 1134   MCHC 32.8 06/23/2019 1134   RDW 13.1 06/23/2019 1134   LYMPHSABS 2.3  06/23/2019 1134   MONOABS 0.4 06/23/2019 1134   EOSABS 0.1 06/23/2019 1134   BASOSABS 0.1 06/23/2019 1134   CMP    Component Value Date/Time   NA 140 06/23/2019 1137   K 4.2 06/23/2019 1137   CL 104 06/23/2019 1137   CO2 24 06/23/2019 1134   GLUCOSE 157 (H) 06/23/2019 1137   BUN 14 06/23/2019 1137   CREATININE 1.10 06/23/2019 1137   CALCIUM 8.5 (L) 06/23/2019 1134   PROT 7.0 06/23/2019 1134   ALBUMIN 3.7 06/23/2019 1134   AST 29 06/23/2019 1134   ALT 25 06/23/2019 1134   ALKPHOS 70 06/23/2019 1134   BILITOT 0.9 06/23/2019 1134   GFRNONAA 56 (L) 06/23/2019 1134   GFRAA >60 06/23/2019 1134   COAGS Lab Results  Component Value Date   INR 1.0 06/23/2019   Lipid Panel    Component Value Date/Time   CHOL 175 06/24/2019 0200   TRIG 78 06/24/2019 0200   HDL 45 06/24/2019 0200   CHOLHDL 3.9 06/24/2019 0200   VLDL 16 06/24/2019 0200   LDLCALC 114 (H) 06/24/2019 0200   HgbA1C  Lab Results  Component Value Date   HGBA1C 6.9 (H) 06/23/2019    SIGNIFICANT DIAGNOSTIC STUDIES CT ANGIO HEAD  W OR WO CONTRAST  Result Date: 06/23/2019 CLINICAL DATA:  Right-sided weakness and slurred speech. EXAM: CT ANGIOGRAPHY HEAD AND NECK TECHNIQUE: Multidetector CT imaging of the head and neck was performed using the standard protocol during bolus administration of intravenous contrast. Multiplanar CT image reconstructions and MIPs were obtained to evaluate the vascular anatomy. Carotid stenosis measurements (when applicable) are obtained utilizing NASCET criteria, using the distal internal carotid diameter as the denominator. CONTRAST:  77mL OMNIPAQUE IOHEXOL 350 MG/ML SOLN COMPARISON:  None. FINDINGS: CTA NECK FINDINGS Aortic arch: Standard 3 vessel aortic arch with mild atherosclerotic plaque. Widely patent arch vessel origins. Right carotid system: Patent with soft plaque in the carotid bulb resulting in luminal irregularity but only 25% proximal ICA stenosis. Left carotid system: Patent with  scattered soft plaque in the common and internal carotid arteries resulting in luminal irregularity but no significant stenosis. Vertebral arteries: Patent with the left being moderately dominant. Mild multifocal narrowing of the V1 and proximal V2 segments on the right. Skeleton: Mild cervical disc degeneration. Severe right-sided facet arthrosis at C3-4 and C4-5 with facet ankylosis at the latter. Other neck: 8 mm hypoattenuating nodule in the left thyroid lobe for which no further imaging evaluation is recommended. Upper chest: No apical lung consolidation or mass. Review of the MIP images confirms the above findings CTA HEAD FINDINGS Anterior circulation: The internal carotid arteries are patent from skull base to carotid termini with mild atherosclerotic plaque bilaterally not resulting in significant stenosis. ACAs and MCAs are patent without evidence of proximal branch occlusion. There is a mild distal left M1 stenosis. There is MCA branch vessel irregularity bilaterally including a moderate to severe right M2 superior division branch vessel stenosis. There is also a severe left A2 stenosis. No aneurysm is identified. Posterior circulation: The intracranial vertebral arteries are patent to the basilar with mild irregularity bilaterally but no significant stenosis. Patent AICA and SCA origins are seen bilaterally. The basilar artery is patent with mild diffuse irregularity but no significant stenosis. Posterior communicating arteries are not identified and may be diminutive or absent. PCAs are patent with a severe stenosis near the left P1-P2 junction. No aneurysm is identified. Venous sinuses: Patent. Anatomic variants: None. Review of the MIP images confirms the above findings IMPRESSION: 1. No large vessel occlusion. 2. Intracranial atherosclerosis including severe proximal left PCA and left A2 stenoses. 3. Cervical carotid artery atherosclerosis without significant stenosis. 4. Patent vertebral arteries  with mild proximal stenosis on the right. These results were communicated to Dr. Laurence Slate at 12:12 pm on 06/23/2019 by text page via the [facility] messaging system. Electronically Signed   By: Sebastian Ache M.D.   On: 06/23/2019 12:25   CT ANGIO NECK W OR WO CONTRAST  Result Date: 06/23/2019 CLINICAL DATA:  Right-sided weakness and slurred speech. EXAM: CT ANGIOGRAPHY HEAD AND NECK TECHNIQUE: Multidetector CT imaging of the head and neck was performed using the standard protocol during bolus administration of intravenous contrast. Multiplanar CT image reconstructions and MIPs were obtained to evaluate the vascular anatomy. Carotid stenosis measurements (when applicable) are obtained utilizing NASCET criteria, using the distal internal carotid diameter as the denominator. CONTRAST:  76mL OMNIPAQUE IOHEXOL 350 MG/ML SOLN COMPARISON:  None. FINDINGS: CTA NECK FINDINGS Aortic arch: Standard 3 vessel aortic arch with mild atherosclerotic plaque. Widely patent arch vessel origins. Right carotid system: Patent with soft plaque in the carotid bulb resulting in luminal irregularity but only 25% proximal ICA stenosis. Left carotid system: Patent with scattered soft plaque  in the common and internal carotid arteries resulting in luminal irregularity but no significant stenosis. Vertebral arteries: Patent with the left being moderately dominant. Mild multifocal narrowing of the V1 and proximal V2 segments on the right. Skeleton: Mild cervical disc degeneration. Severe right-sided facet arthrosis at C3-4 and C4-5 with facet ankylosis at the latter. Other neck: 8 mm hypoattenuating nodule in the left thyroid lobe for which no further imaging evaluation is recommended. Upper chest: No apical lung consolidation or mass. Review of the MIP images confirms the above findings CTA HEAD FINDINGS Anterior circulation: The internal carotid arteries are patent from skull base to carotid termini with mild atherosclerotic plaque bilaterally not  resulting in significant stenosis. ACAs and MCAs are patent without evidence of proximal branch occlusion. There is a mild distal left M1 stenosis. There is MCA branch vessel irregularity bilaterally including a moderate to severe right M2 superior division branch vessel stenosis. There is also a severe left A2 stenosis. No aneurysm is identified. Posterior circulation: The intracranial vertebral arteries are patent to the basilar with mild irregularity bilaterally but no significant stenosis. Patent AICA and SCA origins are seen bilaterally. The basilar artery is patent with mild diffuse irregularity but no significant stenosis. Posterior communicating arteries are not identified and may be diminutive or absent. PCAs are patent with a severe stenosis near the left P1-P2 junction. No aneurysm is identified. Venous sinuses: Patent. Anatomic variants: None. Review of the MIP images confirms the above findings IMPRESSION: 1. No large vessel occlusion. 2. Intracranial atherosclerosis including severe proximal left PCA and left A2 stenoses. 3. Cervical carotid artery atherosclerosis without significant stenosis. 4. Patent vertebral arteries with mild proximal stenosis on the right. These results were communicated to Dr. Laurence Slate at 12:12 pm on 06/23/2019 by text page via the [facility] messaging system. Electronically Signed   By: Sebastian Ache M.D.   On: 06/23/2019 12:25   ECHOCARDIOGRAM COMPLETE  Result Date: 06/24/2019    ECHOCARDIOGRAM REPORT   Patient Name:   Joseph Arellano Date of Exam: 06/24/2019 Medical Rec #:  [id number]      Height:       70.0 in Accession #:    [id number]     Weight:       191.4 lb Date of Birth:  02/06/1930      BSA:          2.05 m Patient Age:    89 years       BP:           168/62 mmHg Patient Gender: M              HR:           64 bpm. Exam Location:  Inpatient Procedure: 2D Echo Indications:    Stroke 434.91 / I163.9  History:        Patient has no prior history of Echocardiogram examinations.                  Risk Factors:Dyslipidemia and Diabetes.  Sonographer:    Leeroy Bock Turrentine Referring Phys: [id number] SUSHANTH R AROOR IMPRESSIONS  1. Left ventricular ejection fraction, by estimation, is 60 to 65%. The left ventricle has normal function. The left ventrical has no regional wall motion abnormalities. There is mildly increased left ventricular hypertrophy. Left ventricular diastolic parameters are consistent with Grade I diastolic dysfunction (impaired relaxation).  2. Right ventricular systolic function is normal. The right ventricular size is normal. Tricuspid regurgitation signal is inadequate for assessing  PA pressure.  3. Trivial mitral valve regurgitation.  4. The aortic valve is tricuspid. Aortic valve regurgitation is not visualized. Mioderate aortic valve sclerosis/calcification is present, without any evidence of aortic stenosis.  5. The inferior vena cava is normal in size with greater than 50% respiratory variability, suggesting right atrial pressure of 3 mmHg. FINDINGS  Left Ventricle: Left ventricular ejection fraction, by estimation, is 60 to 65%. The left ventricle has normal function. The left ventricle has no regional wall motion abnormalities. The left ventricular internal cavity size was normal in size. There is  mildly increased left ventricular hypertrophy. Concentric left ventricular hypertrophy. Left ventricular diastolic parameters are consistent with Grade I diastolic dysfunction (impaired relaxation). Right Ventricle: The right ventricular size is normal. No increase in right ventricular wall thickness. Right ventricular systolic function is normal. Tricuspid regurgitation signal is inadequate for assessing PA pressure. Left Atrium: Left atrial size was normal in size. Right Atrium: Right atrial size was normal in size. Pericardium: There is no evidence of pericardial effusion. Mitral Valve: The mitral valve is normal in structure and function. Normal mobility of the mitral valve  leaflets. Trivial mitral valve regurgitation. No evidence of mitral valve stenosis. Tricuspid Valve: The tricuspid valve is normal in structure. Tricuspid valve regurgitation is not demonstrated. No evidence of tricuspid stenosis. Aortic Valve: The aortic valve is tricuspid. Aortic valve regurgitation is not visualized. Mild to moderate aortic valve sclerosis/calcification is present, without any evidence of aortic stenosis. Pulmonic Valve: The pulmonic valve was normal in structure. Pulmonic valve regurgitation is not visualized. No evidence of pulmonic stenosis. Aorta: The aortic root is normal in size and structure. Venous: The inferior vena cava is normal in size with greater than 50% respiratory variability, suggesting right atrial pressure of 3 mmHg. The inferior vena cava and the hepatic vein show a normal flow pattern. IAS/Shunts: No atrial level shunt detected by color flow Doppler.  LEFT VENTRICLE PLAX 2D LVIDd:         3.47 cm  Diastology LVIDs:         2.53 cm  LV e' lateral:   6.20 cm/s LV PW:         1.23 cm  LV E/e' lateral: 9.8 LV IVS:        1.24 cm  LV e' medial:    7.72 cm/s LVOT diam:     1.80 cm  LV E/e' medial:  7.9 LV SV:         50.89 ml LV SV Index:   12.86 LVOT Area:     2.54 cm  RIGHT VENTRICLE RV S prime:     16.30 cm/s TAPSE (M-mode): 1.6 cm LEFT ATRIUM           Index       RIGHT ATRIUM           Index LA diam:      3.30 cm 1.61 cm/m  RA Area:     16.50 cm LA Vol (A4C): 59.7 ml 29.14 ml/m RA Volume:   41.40 ml  20.21 ml/m  AORTIC VALVE LVOT Vmax:   94.00 cm/s LVOT Vmean:  71.100 cm/s LVOT VTI:    0.200 m  AORTA Ao Root diam: 3.10 cm MITRAL VALVE MV Area (PHT): 3.23 cm             SHUNTS MV Decel Time: 235 msec             Systemic VTI:  0.20 m MV E velocity: 60.80 cm/s  103 cm/s  Systemic Diam: 1.80 cm MV A velocity: 88.70 cm/s 70.3 cm/s MV E/A ratio:  0.69       1.5 Armanda Magic MD Electronically signed by Armanda Magic MD Signature Date/Time: 06/24/2019/12:12:49 PM    Final    CT  HEAD CODE STROKE WO CONTRAST  Result Date: 06/23/2019 CLINICAL DATA:  Code stroke. 86 year old male last seen normal at 0 900 hours. Right side weakness and slurred speech. EXAM: CT HEAD WITHOUT CONTRAST TECHNIQUE: Contiguous axial images were obtained from the base of the skull through the vertex without intravenous contrast. COMPARISON:  None. FINDINGS: Brain: No acute intracranial hemorrhage identified. No midline shift, mass effect, or evidence of intracranial mass lesion. No ventriculomegaly. Patchy and confluent bilateral cerebral white matter hypodensity. No cortically based acute infarct identified. The deep gray nuclei and posterior fossa remain within normal limits. Vascular: Calcified atherosclerosis at the skull base. No suspicious intracranial vascular hyperdensity. Skull: No acute osseous abnormality identified. Sinuses/Orbits: Bubbly opacity in the left sphenoid sinus, other paranasal sinuses and mastoids are clear. Other: No acute orbit or scalp soft tissue finding. ASPECTS [facility] Stroke Program Early CT Score) Total score (0-10 with 10 being normal): 10 IMPRESSION: 1. No acute cortically based infarct or acute intracranial hemorrhage identified. ASPECTS 10. 2. Symmetric appearing cerebral white matter changes most commonly due to chronic small vessel disease. 3. These results were communicated to Dr. Laurence Slate at 11:48 am on 06/23/2019 by text page via the [facility] messaging system. Electronically Signed   By: Odessa Fleming M.D.   On: 06/23/2019 11:49      HISTORY OF PRESENT ILLNESS Joseph Arellano is a 86 y.o. male with history of hypercholesterolemia, diabetes, depression.  Patient states that he awoke at approximately 8:30 AM this morning 06/23/2019 (LKW).  At 0900 he went to the bathroom to shave when he suddenly noticed that his legs buckled underneath him and noticed he had right-sided weakness. His weakness worsened as he went to the kitchen to the point where he fell and could not get back up.  He  called his daughter at approximately 10 AM who called EMS.  He was brought to [facility] by EMS as a code stroke.  On arrival patient continues to show right facial droop, dysarthria, ataxia and drift on the right.  Immediate CT of head along with CTA of head was obtained.  While on CT table patient did have 1 episode of emesis.  Premorbid modified Rankin scale (mRS): 0. NIH stroke score of 6. Blood pressure needed to be lowered prior to TPA administration, however TPA was administered at 11:56 AM. Approximately 10 to 20 minutes following administration, patient NIH stroke scale improved to 2. He was admitted to the 3W stroke unit for further care.  Relevant labs include -no abnormal labs were noted CT head shows-no acute cortically based infarct or acute intracranial hemorrhage identified.  Symmetrical appearing cerebral white matter changes most commonly due to chronic small vessel disease CTA head neck-no large vessel occlusion, intracranial atherosclerosis including severe proximal left PCA and left A2 stenosis.  Patent vertebral arteries with mild proximal stenosis on the right Chart review   saw Dr. Arbutus Leas in 2015 for abnormal gait, thought to be secondary to his peripheral neuropathy.   HOSPITAL COURSE Joseph Arellano is a 86 y.o. male with history of hypercholesterolemia, diabetes, depression presenting with ataxia, clumsiness and R sided weakness. Received tPA 06/23/2019 at 1156.    L brain TIA s/p tPA  Code  Stroke CT head No acute abnormality. Small vessel disease. ASPECTS 10.     CTA head & neck no LVO. Intracranial atherosclerosis w/ severe proximal L PCA and L A2 stenoses.   MRI  no acute infarct. Old periventricular white matter lacunes esp in L frontal horns. Moderate small vessel disease and atrophy  2D Echo EF 60-65%. No source of embolus  Given his age and risk factors, recommend 30 day cardiac event monitoring to rule out afib as outpt.   LDL 114  HgbA1c 6.9  SCDs  for VTE prophylaxis  No antithrombotic prior to admission, post tPA placed on aspirin 81 and plavix 75 daily. Continue DAPT x 3 weeks then aspirin alone    Therapy recommendations:  HH PT  Disposition:  return home  HTN  Home meds:  medication prescribed from [place]  Required BP control prior to tPA administration  Stable now  BP goal normotensive  Close [facility] follow [facility]  Hyperlipidemia  Home meds:  Cholestid, omega 3 with Cholestid resumed in hospital  Now on lipitor 20  LDL 114, goal < 70  Continue statin and cholestid at discharge  Diabetes type II Controlled  Home meds:  glucophage 1000 daily  HgbA1c 6.9, at goal < 7.0  SSI  CBG monitoring  Follow up in [place]  Other Stroke Risk Factors  Advanced age  Former Cigarette smoker  ETOH use, alcohol level, advised to drink no more than 2 drink(s) a day  Other Active Problems  Hx peripheral neuropathy, seen by Dr. Carles Collet in 2015  Hx B12 deficiency on monthly injections and oral replacement  GERD on PPI  Depression on celexa  DISCHARGE EXAM  Temp:  [97.5 F (36.4 C)-98.7 F (37.1 C)] 98.2 F (36.8 C) (02/09 1050) Pulse Rate:  [60-79] 69 (02/09 1250) Resp:  [14-26] 16 (02/09 1250) BP: (137-177)/(42-100) 171/70 (02/09 1250) SpO2:  [95 %-100 %] 96 % (02/09 1250) Weight:  [86.8 kg] 86.8 kg (02/08 1413)  General - Well nourished, well developed, in no apparent distress.  Ophthalmologic - fundi not visualized due to noncooperation.  Cardiovascular - Regular rhythm and rate.  Mental Status -  Level of arousal and orientation to time, place, and person were intact. Language including expression, naming, repetition, comprehension was assessed and found intact. Fund of Knowledge was assessed and was intact.  Cranial Nerves II - XII - II - Visual field intact OU. III, IV, VI - Extraocular movements intact. V - Facial sensation intact bilaterally. VII - Facial movement intact bilaterally. VIII - Hearing &  vestibular intact bilaterally. X - Palate elevates symmetrically. XI - Chin turning & shoulder shrug intact bilaterally. XII - Tongue protrusion intact.  Motor Strength - The patient's strength was normal in all extremities and pronator drift was absent.  Bulk was normal and fasciculations were absent.   Motor Tone - Muscle tone was assessed at the neck and appendages and was normal.  Reflexes - The patient's reflexes were symmetrical in all extremities and he had no pathological reflexes.  Sensory - Light touch, temperature/pinprick were assessed and were symmetrical.    Coordination - The patient had normal movements in the hands with no ataxia or dysmetria.  Tremor was absent.  Gait and Station - deferred.   Discharge Diet   Heart healthy / carb modified thin liquids  DISCHARGE PLAN  Disposition:  Return home  Home Health PT  aspirin 81 mg daily and clopidogrel 75 mg daily for secondary stroke prevention for 3 weeks then  ASPIRIN alone.  OP 30d monitor to look for AF as source of stroke symptoms arranged with [facility]  Ongoing stroke risk factor control by Primary Care Physician at time of discharge  Follow-up PCP Adrian Prince, MD or [facility] MD in 2 weeks.  Follow-up Dr Tat or [facility] neurologist in 4 weeks  35 minutes were spent preparing discharge.  Marvel Plan, MD PhD Stroke Neurology 06/24/2019 1:42 PM

## 2019-06-24 NOTE — Progress Notes (Signed)
IV and tele removed without complication. Pt discharge information reviewed with daughter [name] at bedside. Medications delivered from [facility] pharmacy. Pt discharged from facility via wheelchair with belongings.

## 2019-06-24 NOTE — Progress Notes (Signed)
Pt passed swallowed eval. performed by Dr Aroor and  Diet order place but pt did not want to eat. Offered ginger aile at 2330 and pt tolerated well.    Mr Joseph Arellano condition has improved significantly. NIH is 2  nd Dysarthria  and rt side weakness has also improve greatly.

## 2019-06-25 ENCOUNTER — Telehealth: Payer: Self-pay | Admitting: *Deleted

## 2019-06-25 LAB — HEMOGLOBIN A1C
Hgb A1c MFr Bld: 6.8 % — ABNORMAL HIGH (ref 4.8–5.6)
Mean Plasma Glucose: 148 mg/dL

## 2019-06-25 NOTE — Telephone Encounter (Signed)
Patient enrolled for [employer] to ship a 30 day cardiac event monitor to his daughters home.  Joseph Arellano, [address], [place], [place] [address].  Instructions reviewed briefly as they are included in the monitor kit.

## 2019-06-27 ENCOUNTER — Other Ambulatory Visit: Payer: Self-pay

## 2019-06-27 DIAGNOSIS — E1142 Type 2 diabetes mellitus with diabetic polyneuropathy: Secondary | ICD-10-CM | POA: Diagnosis not present

## 2019-06-27 DIAGNOSIS — M170 Bilateral primary osteoarthritis of knee: Secondary | ICD-10-CM | POA: Diagnosis not present

## 2019-06-27 DIAGNOSIS — E785 Hyperlipidemia, unspecified: Secondary | ICD-10-CM | POA: Diagnosis not present

## 2019-06-27 DIAGNOSIS — I69351 Hemiplegia and hemiparesis following cerebral infarction affecting right dominant side: Secondary | ICD-10-CM | POA: Diagnosis not present

## 2019-06-27 DIAGNOSIS — I69392 Facial weakness following cerebral infarction: Secondary | ICD-10-CM | POA: Diagnosis not present

## 2019-06-27 DIAGNOSIS — I659 Occlusion and stenosis of unspecified precerebral artery: Secondary | ICD-10-CM | POA: Diagnosis not present

## 2019-06-27 DIAGNOSIS — I69393 Ataxia following cerebral infarction: Secondary | ICD-10-CM | POA: Diagnosis not present

## 2019-06-27 DIAGNOSIS — I10 Essential (primary) hypertension: Secondary | ICD-10-CM | POA: Diagnosis not present

## 2019-06-27 DIAGNOSIS — I69322 Dysarthria following cerebral infarction: Secondary | ICD-10-CM | POA: Diagnosis not present

## 2019-06-27 NOTE — Patient Outreach (Signed)
[facility]) Care Management  06/27/2019  Joseph Arellano 1929/07/17 [id number]    [name]-General Discharge RED ON [name] ALERT Day # 1 Date: 06/26/2019 Red Alert Reason: "Other questions/problems? Yes"   Outreach attempt # 1 to patient. No answer. RN CM left HIPAA compliant voicemail message along with contact info.     Plan: RN CM will make outreach attempt to patient within 3-4 business days. RN CM will send unsuccessful outreach letter to patient.  Antionette Fairy, RN,BSN,CCM [facility] Care Management Telephonic Care Management Coordinator Direct Phone: [phone] Toll Free: [phone] Fax: [phone]

## 2019-06-30 ENCOUNTER — Other Ambulatory Visit: Payer: Self-pay

## 2019-06-30 DIAGNOSIS — I69322 Dysarthria following cerebral infarction: Secondary | ICD-10-CM | POA: Diagnosis not present

## 2019-06-30 DIAGNOSIS — I69392 Facial weakness following cerebral infarction: Secondary | ICD-10-CM | POA: Diagnosis not present

## 2019-06-30 DIAGNOSIS — I10 Essential (primary) hypertension: Secondary | ICD-10-CM | POA: Diagnosis not present

## 2019-06-30 DIAGNOSIS — I659 Occlusion and stenosis of unspecified precerebral artery: Secondary | ICD-10-CM | POA: Diagnosis not present

## 2019-06-30 DIAGNOSIS — E785 Hyperlipidemia, unspecified: Secondary | ICD-10-CM | POA: Diagnosis not present

## 2019-06-30 DIAGNOSIS — I69393 Ataxia following cerebral infarction: Secondary | ICD-10-CM | POA: Diagnosis not present

## 2019-06-30 DIAGNOSIS — I69351 Hemiplegia and hemiparesis following cerebral infarction affecting right dominant side: Secondary | ICD-10-CM | POA: Diagnosis not present

## 2019-06-30 DIAGNOSIS — E1142 Type 2 diabetes mellitus with diabetic polyneuropathy: Secondary | ICD-10-CM | POA: Diagnosis not present

## 2019-06-30 DIAGNOSIS — M170 Bilateral primary osteoarthritis of knee: Secondary | ICD-10-CM | POA: Diagnosis not present

## 2019-06-30 NOTE — Patient Outreach (Signed)
[facility]) Care Management  06/30/2019  KWALI WRINKLE 01/25/1930 [id number]   [name]-General Discharge RED ON [name] ALERT Day # 1 Date: 06/26/2019 Red Alert Reason: "Other questions/problems? Yes"    Outreach attempt #2 to patient. No answer at present.      Plan: RN CM will make outreach attempt to patient within 3-4 business days.  Antionette Fairy, RN,BSN,CCM [facility] Care Management Telephonic Care Management Coordinator Direct Phone: [phone] Toll Free: [phone] Fax: [phone]

## 2019-07-01 ENCOUNTER — Other Ambulatory Visit: Payer: Self-pay

## 2019-07-01 NOTE — Patient Outreach (Signed)
[facility]) Care Management  07/01/2019  OAKLYN MANS Mar 06, 1930 [id number]   [name]-General Discharge RED ON [name] ALERT Day #1 Date:06/26/2019 Red Alert Reason:"Other questions/problems? Yes"   Outreach attempt #3 to patient. Call went straight to voicemail.      Plan: RN CM close case if no return call or  response from letter mailed to patient.    Antionette Fairy, RN,BSN,CCM [facility] Care Management Telephonic Care Management Coordinator Direct Phone: [phone] Toll Free: [phone] Fax: [phone]

## 2019-07-04 DIAGNOSIS — I69392 Facial weakness following cerebral infarction: Secondary | ICD-10-CM | POA: Diagnosis not present

## 2019-07-04 DIAGNOSIS — I69351 Hemiplegia and hemiparesis following cerebral infarction affecting right dominant side: Secondary | ICD-10-CM | POA: Diagnosis not present

## 2019-07-04 DIAGNOSIS — I10 Essential (primary) hypertension: Secondary | ICD-10-CM | POA: Diagnosis not present

## 2019-07-04 DIAGNOSIS — I69393 Ataxia following cerebral infarction: Secondary | ICD-10-CM | POA: Diagnosis not present

## 2019-07-04 DIAGNOSIS — E1142 Type 2 diabetes mellitus with diabetic polyneuropathy: Secondary | ICD-10-CM | POA: Diagnosis not present

## 2019-07-04 DIAGNOSIS — M170 Bilateral primary osteoarthritis of knee: Secondary | ICD-10-CM | POA: Diagnosis not present

## 2019-07-04 DIAGNOSIS — E785 Hyperlipidemia, unspecified: Secondary | ICD-10-CM | POA: Diagnosis not present

## 2019-07-04 DIAGNOSIS — I659 Occlusion and stenosis of unspecified precerebral artery: Secondary | ICD-10-CM | POA: Diagnosis not present

## 2019-07-04 DIAGNOSIS — I69322 Dysarthria following cerebral infarction: Secondary | ICD-10-CM | POA: Diagnosis not present

## 2019-07-07 DIAGNOSIS — I10 Essential (primary) hypertension: Secondary | ICD-10-CM | POA: Diagnosis not present

## 2019-07-07 DIAGNOSIS — I69392 Facial weakness following cerebral infarction: Secondary | ICD-10-CM | POA: Diagnosis not present

## 2019-07-07 DIAGNOSIS — I69351 Hemiplegia and hemiparesis following cerebral infarction affecting right dominant side: Secondary | ICD-10-CM | POA: Diagnosis not present

## 2019-07-07 DIAGNOSIS — I69322 Dysarthria following cerebral infarction: Secondary | ICD-10-CM | POA: Diagnosis not present

## 2019-07-07 DIAGNOSIS — M170 Bilateral primary osteoarthritis of knee: Secondary | ICD-10-CM | POA: Diagnosis not present

## 2019-07-07 DIAGNOSIS — E1142 Type 2 diabetes mellitus with diabetic polyneuropathy: Secondary | ICD-10-CM | POA: Diagnosis not present

## 2019-07-07 DIAGNOSIS — I69393 Ataxia following cerebral infarction: Secondary | ICD-10-CM | POA: Diagnosis not present

## 2019-07-07 DIAGNOSIS — I659 Occlusion and stenosis of unspecified precerebral artery: Secondary | ICD-10-CM | POA: Diagnosis not present

## 2019-07-07 DIAGNOSIS — E785 Hyperlipidemia, unspecified: Secondary | ICD-10-CM | POA: Diagnosis not present

## 2019-07-08 NOTE — Progress Notes (Signed)
NEUROLOGY CONSULTATION NOTE  Joseph Arellano MRN: [id number] DOB: [birth year]  Referring provider: Marvel Plan, MD (hospital referral) Primary care provider: Adrian Prince, MD  Reason for consult:  TIA  HISTORY OF PRESENT ILLNESS: Joseph Arellano is an 86 year old right-handed white male with type 2 diabetes mellitus with peripheral neuropathy, HTN, and HLD who presents for recent TIA.  History supplemented by hospital records.  He was admitted to [facility] on 06/23/2019 after sudden onset of right sided upper and lower extremity weakness.  His leg gave way and he fell.  On arrival in ED, he presented with right facial droop, dysarthria, ataxia and right drift.  NIHSS was 6.  CT hed showed no acute intracranial abnormality.  CTA of head  And neck showed intracranial atherosclerosis with severe proximal left PCA and left A2 stenosis as well as mild right proximal vertebral artery stenosis, but no emergent large vessel occlusion.  He received IV tPA.  MRI of brain showed chronic small vessel ischemic changes with remote lacunar infarcts in the periventricular white matter but no acute infarct.  Echocardiogram showed EF 60-65% with no cardiac source of embolus.  LDL was 114.  Hgb A1c was 6.9.  He was not on antithrombin therapy prior to admission.  At discharge, he was placed on ASA 81mg  and Plavix 75mg  daily for 3 weeks, followed by ASA alone.  He was continued on statin and Cholestid.  Outpatient 30 day cardiac event monitor was recommended.    PAST MEDICAL HISTORY: Past Medical History:  Diagnosis Date  . B12 deficiency   . Depression   . Diabetes (HCC)   . GERD (gastroesophageal reflux disease)   . Hypercholesteremia   . Hypertension   . Peripheral neuropathy   . Pre-diabetes   . Stroke [facility])     PAST SURGICAL HISTORY: Past Surgical History:  Procedure Laterality Date  . CATARACT EXTRACTION Bilateral     MEDICATIONS: Current Outpatient Medications on File Prior to Visit    Medication Sig Dispense Refill  . aspirin EC 81 MG EC tablet Take 1 tablet (81 mg total) by mouth daily.    atorvastatin (LIPITOR) 20 MG tablet Take 1 tablet (20 mg total) by mouth daily at 6 PM. 30 tablet 2  . citalopram (CELEXA) 40 MG tablet Take 40 mg by mouth daily.    . clopidogrel (PLAVIX) 75 MG tablet Take 1 tablet (75 mg total) by mouth daily. Take with aspirin x 3 weeks then stop Plavix and continue aspirin alone 21 tablet 0  . colestipol (COLESTID) 1 G tablet Take 1 g by mouth 2 (two) times daily.    [facility] FIBER PO Take 1 tablet by mouth daily.     . magnesium oxide (MAG-OX) 400 MG tablet Take 400 mg by mouth daily.    . Omega-3 Fatty Acids (FISH OIL PO) Take 1 tablet by mouth daily.     Marland Kitchen omeprazole (PRILOSEC) 20 MG capsule Take 20 mg by mouth daily.    . Probiotic Product (PROBIOTIC PO) Take 1 tablet by mouth daily.     . vitamin B-12 (CYANOCOBALAMIN) 1000 MCG tablet Take 1,000 mcg by mouth daily.     No current facility-administered medications on file prior to visit.    ALLERGIES: No Known Allergies  FAMILY HISTORY: Family History  Problem Relation Age of Onset  . Hypertension Father   . Hypertension Mother     SOCIAL HISTORY: Social History   Socioeconomic History  . Marital  status: Married    Spouse name: Not on file  . Number of children: Not on file  . Years of education: Not on file  . Highest education level: Not on file  Occupational History  . Occupation: retired    Comment: photography  Tobacco Use  . Smoking status: Former Research scientist (life sciences)  . Smokeless tobacco: Never Used  Substance and Sexual Activity  . Alcohol use: Yes    Comment: glass of wine every night (sometimes 2 max)  . Drug use: No  . Sexual activity: Not Currently    Partners: Male  Other Topics Concern  . Not on file  Social History Narrative  . Not on file   Social Determinants of Health   Financial Resource Strain:   . Difficulty of Paying Living Expenses: Not on file  Food  Insecurity:   . Worried About Charity fundraiser in the Last Year: Not on file  . Ran Out of Food in the Last Year: Not on file  Transportation Needs:   . Lack of Transportation (Medical): Not on file  . Lack of Transportation (Non-Medical): Not on file  Physical Activity:   . Days of Exercise per Week: Not on file  . Minutes of Exercise per Session: Not on file  Stress:   . Feeling of Stress : Not on file  Social Connections:   . Frequency of Communication with Friends and Family: Not on file  . Frequency of Social Gatherings with Friends and Family: Not on file  . Attends Religious Services: Not on file  . Active Member of Clubs or Organizations: Not on file  . Attends Archivist Meetings: Not on file  . Marital Status: Not on file  Intimate Partner Violence:   . Fear of Current or Ex-Partner: Not on file  . Emotionally Abused: Not on file  . Physically Abused: Not on file  . Sexually Abused: Not on file    PHYSICAL EXAM: Blood pressure (!) 170/88, pulse 97, resp. rate 18, height 5\' 10"  (1.778 m), weight 193 lb (87.5 kg), SpO2 100 %. General: No acute distress.  Patient appears well-groomed.   Head:  Normocephalic/atraumatic Eyes:  fundi examined but not visualized Neck: supple, no paraspinal tenderness, full range of motion Back: No paraspinal tenderness Heart: regular rate and rhythm Lungs: Clear to auscultation bilaterally. Vascular: No carotid bruits. Neurological Exam: Mental status: alert and oriented to person, place, and time, recent and remote memory grossly intact, fund of knowledge intact, attention and concentration intact, speech fluent and not dysarthric, language intact. Cranial nerves: CN I: not tested CN II: pupils equal, round and reactive to light, visual fields intact CN III, IV, VI:  full range of motion, no nystagmus, no ptosis CN V: facial sensation intact CN VII: upper and lower face symmetric CN VIII: hearing intact CN IX, X: gag  intact, uvula midline CN XI: sternocleidomastoid and trapezius muscles intact CN XII: tongue midline Bulk & Tone: normal, no fasciculations. Motor:  5/5 throughout  Sensation:  Pinprick sensation intact; and vibration sensation reduced in feet. Deep Tendon Reflexes:  2+ throughout except absent in ankles, toes downgoing.  Finger to nose testing:  Without dysmetria.  Heel to shin:  Without dysmetria.  Gait:  Normal station and stride.  Able to turn. Romberg with sway.  IMPRESSION: 1.  Left hemispheric transient ischemic attack 2.  Type 2 diabetes mellitus 3.  Hypertension 4.  Hyperlipidemia  PLAN: 1.  Continue ASA 81mg  and Plavix 75mg  daily until  March 3, and then ASA 81mg  daily alone for secondary stroke prevention 2.  Continue atorvastatin 20mg  daily, LDL goal less than 70.  Repeat lipid panel a week prior to follow up. 3.  Continue glucophage (Hgb A1c at goal less than 70) 4.  Will order 30 day cardiac event monitor to evaluate for atrial fibrillation. 5.  Advised to contact Dr. office regarding elevated blood pressure 6.  Follow up 4 months  Thank you for allowing me to take part in the care of this patient.  , DO  CC: Rinaldo Cloud, MD

## 2019-07-09 ENCOUNTER — Ambulatory Visit: Payer: Medicare HMO | Admitting: Neurology

## 2019-07-09 ENCOUNTER — Encounter: Payer: Self-pay | Admitting: Neurology

## 2019-07-09 ENCOUNTER — Other Ambulatory Visit: Payer: Self-pay

## 2019-07-09 ENCOUNTER — Encounter: Payer: Self-pay | Admitting: *Deleted

## 2019-07-09 VITALS — BP 170/88 | HR 97 | Resp 18 | Ht 70.0 in | Wt 193.0 lb

## 2019-07-09 DIAGNOSIS — Z8673 Personal history of transient ischemic attack (TIA), and cerebral infarction without residual deficits: Secondary | ICD-10-CM | POA: Diagnosis not present

## 2019-07-09 DIAGNOSIS — I10 Essential (primary) hypertension: Secondary | ICD-10-CM

## 2019-07-09 DIAGNOSIS — D510 Vitamin B12 deficiency anemia due to intrinsic factor deficiency: Secondary | ICD-10-CM | POA: Diagnosis not present

## 2019-07-09 DIAGNOSIS — I129 Hypertensive chronic kidney disease with stage 1 through stage 4 chronic kidney disease, or unspecified chronic kidney disease: Secondary | ICD-10-CM | POA: Diagnosis not present

## 2019-07-09 DIAGNOSIS — E785 Hyperlipidemia, unspecified: Secondary | ICD-10-CM | POA: Diagnosis not present

## 2019-07-09 DIAGNOSIS — G459 Transient cerebral ischemic attack, unspecified: Secondary | ICD-10-CM | POA: Diagnosis not present

## 2019-07-09 DIAGNOSIS — E1142 Type 2 diabetes mellitus with diabetic polyneuropathy: Secondary | ICD-10-CM | POA: Diagnosis not present

## 2019-07-09 DIAGNOSIS — E1149 Type 2 diabetes mellitus with other diabetic neurological complication: Secondary | ICD-10-CM | POA: Diagnosis not present

## 2019-07-09 DIAGNOSIS — M109 Gout, unspecified: Secondary | ICD-10-CM | POA: Diagnosis not present

## 2019-07-09 DIAGNOSIS — E1159 Type 2 diabetes mellitus with other circulatory complications: Secondary | ICD-10-CM | POA: Diagnosis not present

## 2019-07-09 DIAGNOSIS — I5189 Other ill-defined heart diseases: Secondary | ICD-10-CM | POA: Diagnosis not present

## 2019-07-09 DIAGNOSIS — N183 Chronic kidney disease, stage 3 unspecified: Secondary | ICD-10-CM | POA: Diagnosis not present

## 2019-07-09 NOTE — Patient Instructions (Addendum)
1.  Continue aspirin 81mg  daily and Plavix 75mg  daily.  On March 3, you may stop Plavix and just continue aspirin 81mg  daily. 2.  Continue other medications 3.  Will get a 30 day cardiac event monitor 4.  Contact Dr. office regarding elevated blood pressure 5.  Follow up in 4 months. 6.  Repeat lipid panel about a week prior to follow up.  Cardiac event monitor will be placed at [address] in [place], They will contact you.

## 2019-07-09 NOTE — Patient Outreach (Signed)
[facility]) Care Management  07/09/2019  [name] November 07, 1929 [id number]   [name]-General Discharge RED ON [name] ALERT Day #1 Date:06/26/2019 Red Alert Reason:"Other questions/problems? Yes"   Multiple attempts to establish contact with patient without success. No response from letter mailed to patient. Case is being closed at this time.     Plan: RN CM will close case at this time.   Antionette Fairy, RN,BSN,CCM [facility] Care Management Telephonic Care Management Coordinator Direct Phone: [phone] Toll Free: [phone] Fax: [phone]

## 2019-07-09 NOTE — Progress Notes (Signed)
Patient ID: Joseph Arellano, male   DOB: Aug 29, 1929, 86 y.o.   MRN: [id number] Patient was enrolled for [employer] to ship a 30 day cardiac event monitor to his daughter, Rolanda Jay, address.  Monitor was delivered, Monday, 07/07/19 at front door per [employer] tracking.

## 2019-07-10 ENCOUNTER — Ambulatory Visit (INDEPENDENT_AMBULATORY_CARE_PROVIDER_SITE_OTHER): Payer: Medicare HMO

## 2019-07-10 ENCOUNTER — Other Ambulatory Visit: Payer: Self-pay | Admitting: Physician Assistant

## 2019-07-10 ENCOUNTER — Telehealth: Payer: Self-pay | Admitting: *Deleted

## 2019-07-10 DIAGNOSIS — I659 Occlusion and stenosis of unspecified precerebral artery: Secondary | ICD-10-CM | POA: Diagnosis not present

## 2019-07-10 DIAGNOSIS — E785 Hyperlipidemia, unspecified: Secondary | ICD-10-CM | POA: Diagnosis not present

## 2019-07-10 DIAGNOSIS — I69322 Dysarthria following cerebral infarction: Secondary | ICD-10-CM | POA: Diagnosis not present

## 2019-07-10 DIAGNOSIS — I4891 Unspecified atrial fibrillation: Secondary | ICD-10-CM | POA: Diagnosis not present

## 2019-07-10 DIAGNOSIS — I10 Essential (primary) hypertension: Secondary | ICD-10-CM | POA: Diagnosis not present

## 2019-07-10 DIAGNOSIS — M170 Bilateral primary osteoarthritis of knee: Secondary | ICD-10-CM | POA: Diagnosis not present

## 2019-07-10 DIAGNOSIS — I639 Cerebral infarction, unspecified: Secondary | ICD-10-CM

## 2019-07-10 DIAGNOSIS — I69393 Ataxia following cerebral infarction: Secondary | ICD-10-CM | POA: Diagnosis not present

## 2019-07-10 DIAGNOSIS — I69351 Hemiplegia and hemiparesis following cerebral infarction affecting right dominant side: Secondary | ICD-10-CM | POA: Diagnosis not present

## 2019-07-10 DIAGNOSIS — E1142 Type 2 diabetes mellitus with diabetic polyneuropathy: Secondary | ICD-10-CM | POA: Diagnosis not present

## 2019-07-10 DIAGNOSIS — I69392 Facial weakness following cerebral infarction: Secondary | ICD-10-CM | POA: Diagnosis not present

## 2019-07-10 NOTE — Telephone Encounter (Signed)
Patient's daughter [name] is calling stating she has received the long term monitor. She would like to know if it is okay to wait about a month before the patient wears it if it will not be a health risk, due to them being in the process of trying to move him into an independent living facility. She states they have not really opened the box and can send it back until then. Amire's insurance is also changing on March 1st. Please advise.

## 2019-07-10 NOTE — Telephone Encounter (Signed)
Not a good idea to wait a month to apply monitor.  If not started by the time his new insurance kicks in , we would need you to send monitor back and re-enroll him using new insurance information. Scheduled patient and his daughter, [name], to come into office today, to apply monitor , give instructions and demonstrate functions.

## 2019-07-10 NOTE — Telephone Encounter (Signed)
Follow up   Patient daughter is calling to have appt changed today for Heart monitor. Please call.

## 2019-07-15 ENCOUNTER — Observation Stay (HOSPITAL_COMMUNITY): Payer: Medicare Other

## 2019-07-15 ENCOUNTER — Inpatient Hospital Stay (HOSPITAL_COMMUNITY)
Admission: EM | Admit: 2019-07-15 | Discharge: 2019-07-17 | DRG: 066 | Disposition: A | Discharge status: Home / Self Care | Payer: Medicare Other | Attending: Family Medicine | Admitting: Family Medicine

## 2019-07-15 ENCOUNTER — Encounter (HOSPITAL_COMMUNITY): Payer: Self-pay | Admitting: Internal Medicine

## 2019-07-15 ENCOUNTER — Other Ambulatory Visit: Payer: Self-pay

## 2019-07-15 ENCOUNTER — Emergency Department (HOSPITAL_COMMUNITY): Payer: Medicare Other

## 2019-07-15 DIAGNOSIS — I639 Cerebral infarction, unspecified: Secondary | ICD-10-CM | POA: Diagnosis not present

## 2019-07-15 DIAGNOSIS — I10 Essential (primary) hypertension: Secondary | ICD-10-CM | POA: Diagnosis present

## 2019-07-15 DIAGNOSIS — E1151 Type 2 diabetes mellitus with diabetic peripheral angiopathy without gangrene: Secondary | ICD-10-CM | POA: Diagnosis present

## 2019-07-15 DIAGNOSIS — E7800 Pure hypercholesterolemia, unspecified: Secondary | ICD-10-CM | POA: Diagnosis present

## 2019-07-15 DIAGNOSIS — Z8673 Personal history of transient ischemic attack (TIA), and cerebral infarction without residual deficits: Secondary | ICD-10-CM

## 2019-07-15 DIAGNOSIS — Z7902 Long term (current) use of antithrombotics/antiplatelets: Secondary | ICD-10-CM

## 2019-07-15 DIAGNOSIS — F329 Major depressive disorder, single episode, unspecified: Secondary | ICD-10-CM | POA: Diagnosis present

## 2019-07-15 DIAGNOSIS — K219 Gastro-esophageal reflux disease without esophagitis: Secondary | ICD-10-CM | POA: Diagnosis present

## 2019-07-15 DIAGNOSIS — E119 Type 2 diabetes mellitus without complications: Secondary | ICD-10-CM

## 2019-07-15 DIAGNOSIS — Z87891 Personal history of nicotine dependence: Secondary | ICD-10-CM

## 2019-07-15 DIAGNOSIS — Z794 Long term (current) use of insulin: Secondary | ICD-10-CM

## 2019-07-15 DIAGNOSIS — R29810 Facial weakness: Secondary | ICD-10-CM | POA: Diagnosis present

## 2019-07-15 DIAGNOSIS — H53461 Homonymous bilateral field defects, right side: Secondary | ICD-10-CM | POA: Diagnosis present

## 2019-07-15 DIAGNOSIS — E538 Deficiency of other specified B group vitamins: Secondary | ICD-10-CM | POA: Diagnosis present

## 2019-07-15 DIAGNOSIS — Z66 Do not resuscitate: Secondary | ICD-10-CM | POA: Diagnosis present

## 2019-07-15 DIAGNOSIS — E1142 Type 2 diabetes mellitus with diabetic polyneuropathy: Secondary | ICD-10-CM | POA: Diagnosis present

## 2019-07-15 DIAGNOSIS — Z20822 Contact with and (suspected) exposure to covid-19: Secondary | ICD-10-CM | POA: Diagnosis present

## 2019-07-15 DIAGNOSIS — E785 Hyperlipidemia, unspecified: Secondary | ICD-10-CM | POA: Diagnosis present

## 2019-07-15 DIAGNOSIS — Z7982 Long term (current) use of aspirin: Secondary | ICD-10-CM

## 2019-07-15 DIAGNOSIS — Z79899 Other long term (current) drug therapy: Secondary | ICD-10-CM

## 2019-07-15 DIAGNOSIS — R29701 NIHSS score 1: Secondary | ICD-10-CM | POA: Diagnosis present

## 2019-07-15 DIAGNOSIS — E1159 Type 2 diabetes mellitus with other circulatory complications: Secondary | ICD-10-CM | POA: Diagnosis not present

## 2019-07-15 LAB — COMPREHENSIVE METABOLIC PANEL
ALT: 21 U/L (ref 0–44)
AST: 24 U/L (ref 15–41)
Albumin: 3.6 g/dL (ref 3.5–5.0)
Alkaline Phosphatase: 62 U/L (ref 38–126)
Anion gap: 9 (ref 5–15)
BUN: 22 mg/dL (ref 8–23)
CO2: 27 mmol/L (ref 22–32)
Calcium: 9.1 mg/dL (ref 8.9–10.3)
Chloride: 104 mmol/L (ref 98–111)
Creatinine, Ser: 1.04 mg/dL (ref 0.61–1.24)
GFR calc Af Amer: >60 mL/min (ref >60)
GFR calc non Af Amer: >60 mL/min (ref >60)
Glucose, Bld: 116 mg/dL — ABNORMAL HIGH (ref 70–99)
Potassium: 3.6 mmol/L (ref 3.5–5.1)
Sodium: 140 mmol/L (ref 135–145)
Total Bilirubin: 0.6 mg/dL (ref 0.3–1.2)
Total Protein: 7.2 g/dL (ref 6.5–8.1)

## 2019-07-15 LAB — DIFFERENTIAL
Abs Immature Granulocytes: 0.03 10*3/uL (ref 0.00–0.07)
Basophils Absolute: 0.1 10*3/uL (ref 0.0–0.1)
Basophils Relative: 1 %
Eosinophils Absolute: 0.2 10*3/uL (ref 0.0–0.5)
Eosinophils Relative: 2 %
Immature Granulocytes: 0 %
Lymphocytes Relative: 27 %
Lymphs Abs: 2.6 10*3/uL (ref 0.7–4.0)
Monocytes Absolute: 0.6 10*3/uL (ref 0.1–1.0)
Monocytes Relative: 6 %
Neutro Abs: 6 10*3/uL (ref 1.7–7.7)
Neutrophils Relative %: 64 %

## 2019-07-15 LAB — I-STAT CHEM 8, ED
BUN: 24 mg/dL — ABNORMAL HIGH (ref 8–23)
Calcium, Ion: 1.2 mmol/L (ref 1.15–1.40)
Chloride: 102 mmol/L (ref 98–111)
Creatinine, Ser: 1 mg/dL (ref 0.61–1.24)
Glucose, Bld: 112 mg/dL — ABNORMAL HIGH (ref 70–99)
HCT: 39 % (ref 39.0–52.0)
Hemoglobin: 13.3 g/dL (ref 13.0–17.0)
Potassium: 3.6 mmol/L (ref 3.5–5.1)
Sodium: 141 mmol/L (ref 135–145)
TCO2: 28 mmol/L (ref 22–32)

## 2019-07-15 LAB — PROTIME-INR
INR: 1 (ref 0.8–1.2)
Prothrombin Time: 12.6 seconds (ref 11.4–15.2)

## 2019-07-15 LAB — CBC
HCT: 40.4 % (ref 39.0–52.0)
Hemoglobin: 13.1 g/dL (ref 13.0–17.0)
MCH: 29.7 pg (ref 26.0–34.0)
MCHC: 32.4 g/dL (ref 30.0–36.0)
MCV: 91.6 fL (ref 80.0–100.0)
Platelets: 299 10*3/uL (ref 150–400)
RBC: 4.41 MIL/uL (ref 4.22–5.81)
RDW: 13.2 % (ref 11.5–15.5)
WBC: 9.4 10*3/uL (ref 4.0–10.5)
nRBC: 0 % (ref 0.0–0.2)

## 2019-07-15 LAB — GLUCOSE, CAPILLARY: Glucose-Capillary: 68 mg/dL — ABNORMAL LOW (ref 70–99)

## 2019-07-15 LAB — CBG MONITORING, ED
Glucose-Capillary: 112 mg/dL — ABNORMAL HIGH (ref 70–99)
Glucose-Capillary: 107 mg/dL — ABNORMAL HIGH (ref 70–99)

## 2019-07-15 LAB — APTT: aPTT: 28 seconds (ref 24–36)

## 2019-07-15 MED ORDER — IOHEXOL 350 MG/ML SOLN
100.0000 mL | Freq: Once | INTRAVENOUS | Status: AC | PRN
  Administered 2019-07-15: 100 mL via INTRAVENOUS

## 2019-07-15 MED ORDER — SODIUM CHLORIDE 0.9 % IV SOLN: INTRAVENOUS | Status: AC

## 2019-07-15 MED ORDER — INSULIN ASPART 100 UNIT/ML FLEXPEN: 14.0000 [IU] | PEN_INJECTOR | SUBCUTANEOUS | Status: DC

## 2019-07-15 MED ORDER — HYDRALAZINE HCL 20 MG/ML IJ SOLN: 10.0000 mg | INTRAMUSCULAR | Status: DC | PRN

## 2019-07-15 MED ORDER — STROKE: EARLY STAGES OF RECOVERY BOOK
Freq: Once | Status: AC
  Filled 2019-07-15: qty 1

## 2019-07-15 MED ORDER — OMEGA-3-ACID ETHYL ESTERS 1 G PO CAPS
1.0000 g | ORAL_CAPSULE | Freq: Every day | ORAL | Status: DC
  Administered 2019-07-16 – 2019-07-17 (×2): 1 g via ORAL
  Filled 2019-07-15 (×2): qty 1

## 2019-07-15 MED ORDER — VITAMIN B-12 1000 MCG PO TABS
1000.0000 ug | ORAL_TABLET | Freq: Every day | ORAL | Status: DC
  Administered 2019-07-16 – 2019-07-17 (×2): 1000 ug via ORAL
  Filled 2019-07-15 (×2): qty 1

## 2019-07-15 MED ORDER — IOHEXOL 350 MG/ML SOLN: 100.0000 mL | Freq: Once | INTRAVENOUS | Status: DC | PRN

## 2019-07-15 MED ORDER — INSULIN ASPART 100 UNIT/ML ~~LOC~~ SOLN
0.0000 [IU] | Freq: Three times a day (TID) | SUBCUTANEOUS | Status: DC
  Administered 2019-07-16 (×2): 1 [IU] via SUBCUTANEOUS

## 2019-07-15 MED ORDER — ENOXAPARIN SODIUM 40 MG/0.4ML ~~LOC~~ SOLN
40.0000 mg | SUBCUTANEOUS | Status: DC
  Administered 2019-07-15: 40 mg via SUBCUTANEOUS
  Filled 2019-07-15: qty 0.4

## 2019-07-15 MED ORDER — CLOPIDOGREL BISULFATE 75 MG PO TABS
75.0000 mg | ORAL_TABLET | Freq: Every day | ORAL | Status: DC
  Administered 2019-07-16 – 2019-07-17 (×2): 75 mg via ORAL
  Filled 2019-07-15 (×2): qty 1

## 2019-07-15 MED ORDER — ACETAMINOPHEN 160 MG/5ML PO SOLN: 650.0000 mg | ORAL | Status: DC | PRN

## 2019-07-15 MED ORDER — ATORVASTATIN CALCIUM 10 MG PO TABS
20.0000 mg | ORAL_TABLET | Freq: Every day | ORAL | Status: DC
  Administered 2019-07-16: 20 mg via ORAL
  Filled 2019-07-15: qty 2

## 2019-07-15 MED ORDER — ACETAMINOPHEN 650 MG RE SUPP: 650.0000 mg | RECTAL | Status: DC | PRN

## 2019-07-15 MED ORDER — ACETAMINOPHEN 325 MG PO TABS
650.0000 mg | ORAL_TABLET | Freq: Once | ORAL | Status: AC
  Administered 2019-07-15: 650 mg via ORAL
  Filled 2019-07-15: qty 2

## 2019-07-15 MED ORDER — ACETAMINOPHEN 325 MG PO TABS
650.0000 mg | ORAL_TABLET | ORAL | Status: DC | PRN
  Administered 2019-07-16: 650 mg via ORAL
  Filled 2019-07-15: qty 2

## 2019-07-15 MED ORDER — PANTOPRAZOLE SODIUM 40 MG PO TBEC
40.0000 mg | DELAYED_RELEASE_TABLET | Freq: Every day | ORAL | Status: DC
  Administered 2019-07-16 – 2019-07-17 (×2): 40 mg via ORAL
  Filled 2019-07-15 (×2): qty 1

## 2019-07-15 MED ORDER — ASPIRIN EC 81 MG PO TBEC
81.0000 mg | DELAYED_RELEASE_TABLET | Freq: Every day | ORAL | Status: DC
  Administered 2019-07-16 – 2019-07-17 (×2): 81 mg via ORAL
  Filled 2019-07-15 (×2): qty 1

## 2019-07-15 MED ORDER — INSULIN ASPART 100 UNIT/ML ~~LOC~~ SOLN: 14.0000 [IU] | Freq: Two times a day (BID) | SUBCUTANEOUS | Status: DC

## 2019-07-15 MED ORDER — MAGNESIUM OXIDE 400 (241.3 MG) MG PO TABS: 400.0000 mg | ORAL_TABLET | Freq: Every day | ORAL | Status: DC | PRN

## 2019-07-15 MED ORDER — INSULIN GLARGINE 100 UNIT/ML ~~LOC~~ SOLN
36.0000 [IU] | Freq: Every day | SUBCUTANEOUS | Status: DC
  Administered 2019-07-16: 36 [IU] via SUBCUTANEOUS
  Filled 2019-07-15 (×3): qty 0.36

## 2019-07-15 NOTE — ED Triage Notes (Signed)
Pt arrives POV for eval of vision changes over the last two days. Pt reports he had a CVA 2 weeks ago and was admitted for same. Pt reports that over the last 36 hours he has noted a R visual field cut that he reported to his daughter this AM. Pt w/ minor residual facial droop, daughter reports face looks improved. Pt is otherwise neuro intact in triage. Does endorse frontal HA since this AM at about 10. No drift, no unilateral weakness.

## 2019-07-15 NOTE — ED Provider Notes (Signed)
[facility] -  EMERGENCY DEPARTMENT Provider Note   CSN: [id number] Arrival date & time: 07/15/19  1659     History Chief Complaint  Patient presents with  . Vision Changes    Joseph Arellano is a 86 y.o. male hx of DM, HL, HTN, here presenting with right visual field cut.  Patient states that he was seen a month ago and had a stroke and had slurred speech at that time.  Patient was admitted in the hospital and has a left PCA stenosis. Patient is started on dual platelet therapy.  Patient states that for the last 3 days, he noticed that he has trouble seeing out of the right upper corner of his eyes.  He denies any trouble speaking.  Denies any weakness or numbness.  The history is provided by the patient.       Past Medical History:  Diagnosis Date  . B12 deficiency   . Depression   . Diabetes (HCC)   . GERD (gastroesophageal reflux disease)   . Hypercholesteremia   . Hypertension   . Peripheral neuropathy   . Pre-diabetes   . Stroke [facility])     Patient Active Problem List   Diagnosis Date Noted  . Hyperlipidemia 06/24/2019  . Diabetes mellitus type II, controlled (HCC) 06/24/2019  . B12 deficiency 06/24/2019  . GERD (gastroesophageal reflux disease) 06/24/2019  . Depression 06/24/2019  . Stroke (HCC) 06/23/2019  . Diabetic peripheral neuropathy (HCC) 01/09/2014    Past Surgical History:  Procedure Laterality Date  . CATARACT EXTRACTION Bilateral        Family History  Problem Relation Age of Onset  . Hypertension Father   . Hypertension Mother     Social History   Tobacco Use  . Smoking status: Former Games developer  . Smokeless tobacco: Never Used  Substance Use Topics  . Alcohol use: Yes    Comment: glass of wine every night (sometimes 2 max)  . Drug use: No    Home Medications Prior to Admission medications   Medication Sig Start Date End Date Taking? Authorizing Provider  aspirin EC 81 MG EC tablet Take 1 tablet (81 mg total) by mouth  daily. 06/25/19   Layne Benton, NP  atorvastatin (LIPITOR) 20 MG tablet Take 1 tablet (20 mg total) by mouth daily at 6 PM. 06/24/19   Layne Benton, NP  citalopram (CELEXA) 40 MG tablet Take 40 mg by mouth daily.    [provider]  clopidogrel (PLAVIX) 75 MG tablet Take 1 tablet (75 mg total) by mouth daily. Take with aspirin x 3 weeks then stop Plavix and continue aspirin alone 06/25/19   Layne Benton, NP  colestipol (COLESTID) 1 G tablet Take 1 g by mouth 2 (two) times daily.    [provider]  FIBER PO Take 1 tablet by mouth daily.     [provider]  losartan (COZAAR) 25 MG tablet  06/27/19   [provider]  magnesium oxide (MAG-OX) 400 MG tablet Take 400 mg by mouth daily.    [provider]  Omega-3 Fatty Acids (FISH OIL PO) Take 1 tablet by mouth daily.     [provider]  omeprazole (PRILOSEC) 20 MG capsule Take 20 mg by mouth daily.    [provider]  Probiotic Product (PROBIOTIC PO) Take 1 tablet by mouth daily.     [provider]  vitamin B-12 (CYANOCOBALAMIN) 1000 MCG tablet Take 1,000 mcg by mouth daily.  [provider]    Allergies    Patient has no known allergies.  Review of Systems   Review of Systems  Eyes: Positive for visual disturbance.  All other systems reviewed and are negative.   Physical Exam Updated Vital Signs BP (!) 179/78 (BP Location: Left Arm)   Pulse 77   Temp 98.5 F (36.9 C)   Resp 20   Ht 5\' 10"  (1.778 m)   Wt 87.5 kg   SpO2 100%   BMI 27.68 kg/m   Physical Exam Vitals and nursing note reviewed.  Constitutional:      Appearance: Normal appearance.  HENT:     Head: Normocephalic.     Nose: Nose normal.     Mouth/Throat:     Mouth: Mucous membranes are moist.  Eyes:     Pupils: Pupils are equal, round, and reactive to light.     Comments: R upper visual field cut   Cardiovascular:     Rate and Rhythm: Normal rate and regular rhythm.      Pulses: Normal pulses.     Heart sounds: Normal heart sounds.  Pulmonary:     Effort: Pulmonary effort is normal.     Breath sounds: Normal breath sounds.  Abdominal:     General: Abdomen is flat.     Palpations: Abdomen is soft.  Musculoskeletal:        General: Normal range of motion.     Cervical back: Normal range of motion.  Skin:    General: Skin is warm.     Capillary Refill: Capillary refill takes less than 2 seconds.  Neurological:     Mental Status: He is alert and oriented to person, place, and time.     Comments: R upper field cut, extra ocular movements intact, CN 2- 12 intact otherwise, nl strength throughout, nl gait   Psychiatric:        Mood and Affect: Mood normal.     ED Results / Procedures / Treatments   Labs (all labs ordered are listed, but only abnormal results are displayed) Labs Reviewed  COMPREHENSIVE METABOLIC PANEL - Abnormal; Notable for the following components:      Result Value   Glucose, Bld 116 (*)    All other components within normal limits  I-STAT CHEM 8, ED - Abnormal; Notable for the following components:   BUN 24 (*)    Glucose, Bld 112 (*)    All other components within normal limits  CBG MONITORING, ED - Abnormal; Notable for the following components:   Glucose-Capillary 112 (*)    All other components within normal limits  CBG MONITORING, ED - Abnormal; Notable for the following components:   Glucose-Capillary 107 (*)    All other components within normal limits  PROTIME-INR  APTT  CBC  DIFFERENTIAL    EKG EKG Interpretation  Date/Time:  Tuesday July 15 2019 18:46:08 EST Ventricular Rate:  69 PR Interval:    QRS Duration: 158 QT Interval:  448 QTC Calculation: 480 R Axis:   -67 Text Interpretation: Sinus or ectopic atrial rhythm Borderline prolonged PR interval RBBB and LAFB Left ventricular hypertrophy No significant change since last tracing Confirmed by 06-10-2000 [phone]) on 07/15/2019 6:55:57  PM   Radiology CT HEAD WO CONTRAST  Result Date: 07/15/2019 CLINICAL DATA:  Right visual field defect. Facial droop. EXAM: CT HEAD WITHOUT CONTRAST TECHNIQUE: Contiguous axial images were obtained from the base of the skull through the vertex without intravenous contrast. COMPARISON:  June 23, 2019. FINDINGS: Brain: Mild diffuse cortical atrophy is noted. Mild chronic ischemic white matter disease is noted. No mass effect or midline shift is noted. New wedge-shaped low density is noted involving the left occipital lobe concerning for acute infarction. No hemorrhage or mass lesion is noted. Vascular: No hyperdense vessel or unexpected calcification. Skull: Normal. Negative for fracture or focal lesion. Sinuses/Orbits: No acute finding. Other: None. IMPRESSION: New wedge-shaped low density is noted involving the left occipital lobe concerning for acute infarction. MRI is recommended for further evaluation. Electronically Signed   By: Marijo Conception M.D.   On: 07/15/2019 18:14    Procedures Procedures (including critical care time)  Medications Ordered in ED Medications  acetaminophen (TYLENOL) tablet 650 mg (650 mg Oral Given 07/15/19 1851)    ED Course  I have reviewed the triage vital signs and the nursing notes.  Pertinent labs & imaging results that were available during my care of the patient were reviewed by me and considered in my medical decision making (see chart for details).    MDM Rules/Calculators/A&P                      THERAN VANDERGRIFT is a 86 y.o. male here with R visual field cut for 3 days. No dysmetria. Nl gait. CT showed acute L occipital infarct. Talked to Dr. Lorraine Lax from neurology. He will see patient and recommend admission for stroke workup. Hospitalist to admit.    Final Clinical Impression(s) / ED Diagnoses Final diagnoses:  None    Rx / DC Orders ED Discharge Orders    None       Drenda Freeze, MD 07/15/19 1910

## 2019-07-15 NOTE — Consult Note (Addendum)
Neurology Consultation Reason for Consult: Stroke Referring Physician: Toniann Fail, A  CC: Stroke  History is obtained from: Patient  HPI: Joseph Arellano is a 86 y.o. male with a history of diabetes, B12 deficiency, hypercholesterolemia, hypertension who presents with visual changes started yesterday.  He states that he was watching TV and noticed that the right side was kind of blurry.  Due to this being a persistent problem, he came to the emergency department where a CT scan was performed showing a right posterior quadrant stroke.   LKW: 3/1, unclear time tpa given?: no, outside of window    ROS: A 14 point ROS was performed and is negative except as noted in the HPI.   Past Medical History:  Diagnosis Date  . B12 deficiency   . Depression   . Diabetes (HCC)   . GERD (gastroesophageal reflux disease)   . Hypercholesteremia   . Hypertension   . Peripheral neuropathy   . Pre-diabetes   . Stroke [facility])      Family History  Problem Relation Age of Onset  . Hypertension Father   . Hypertension Mother      Social History:  reports that he has quit smoking. He has never used smokeless tobacco. He reports current alcohol use. He reports that he does not use drugs.   Exam: Current vital signs: BP (!) 190/72 (BP Location: Right Arm)   Pulse 65   Temp 97.7 F (36.5 C) (Oral)   Resp 18   Ht 5\' 10"  (1.778 m)   Wt 81.7 kg   SpO2 99%   BMI 25.84 kg/m  Vital signs in last 24 hours: Temp:  [97.7 F (36.5 C)-98.5 F (36.9 C)] 97.7 F (36.5 C) (03/02 2041) Pulse Rate:  [61-77] 65 (03/02 2041) Resp:  [17-25] 18 (03/02 2041) BP: (163-190)/(62-79) 190/72 (03/02 2041) SpO2:  [98 %-100 %] 99 % (03/02 2041) Weight:  [81.7 kg-87.5 kg] 81.7 kg (03/02 2037)   Physical Exam  Constitutional: Appears well-developed and well-nourished.  Psych: Affect appropriate to situation Eyes: No scleral injection HENT: No OP obstrucion MSK: no joint deformities.  Cardiovascular: Normal  rate and regular rhythm.  Respiratory: Effort normal, non-labored breathing GI: Soft.  No distension. There is no tenderness.  Skin: WDI  Neuro: Mental Status: Patient is awake, alert, oriented to person, place, month, year, and situation. Patient is able to give a clear and coherent history. No signs of aphasia or neglect Cranial Nerves: II: Visual Fields are full. Pupils are equal, round, and reactive to light.   III,IV, VI: EOMI without ptosis or diploplia.  V: Facial sensation is symmetric to temperature VII: Facial movement is symmetric.  VIII: hearing is intact to voice X: Uvula elevates symmetrically XI: Shoulder shrug is symmetric. XII: tongue is midline without atrophy or fasciculations.  Motor: Tone is normal. Bulk is normal. 5/5 strength was present in all four extremities.  Sensory: Sensation is symmetric to light touch and temperature in the arms and legs. Deep Tendon Reflexes: 2+ and symmetric in the biceps and patellae.  Plantars: Toes are downgoing bilaterally.  Cerebellar: FNF and HKS are intact bilaterally      I have reviewed labs in epic and the results pertinent to this consultation are: CMP-unremarkable  I have reviewed the images obtained: CT head-posterior stroke on the left  Impression: 86 year old male with posterior stroke of the left.  Certainly given his PCA stenosis, this is a possible etiology, however he has more sparing of the medial pole than  I would expect and therefore I do wonder if this could be a posterior division of the MCA branch infarct.  Especially given that his previous symptoms was numbness and weakness, I think that further evaluation with repeat CTA and MRI are prudent.  Recommendations: 1) MRI brain 2) CTA head and neck 3) telemetry  4) continue aspirin and Plavix  5) stroke team to follow-up   Roland Rack, MD [facility] [phone]  If 7pm- 7am, please page neurology on call as listed in  [place].

## 2019-07-15 NOTE — H&P (Signed)
History and Physical    Joseph Arellano YQM:[id number] DOB: 1930-03-30 DOA: 07/15/2019  PCP: Reynold Bowen, MD  Patient coming from: Home.  Chief Complaint: Right eye blurry vision.  HPI: Joseph Arellano is a 86 y.o. male with history of recent admission for TIA status post TPA discharged on June 24, 2019 3 weeks ago with history of diabetes mellitus, hypertension B12 deficiency started experiencing blurry vision in the right eye over the last 3 days which patient reported to her started this morning.  Patient was brought to the ER.  Denies any weakness of upper or lower extremity.  Denies any difficulty speaking or swallowing.  ED Course: In the ER CT head shows infarct involving the left occipital area.  Neurologist on-call was consulted.  Patient passed swallow.  Patient on exam is able to move all extremities without difficulty.  EKG shows normal sinus rhythm.  Covid test was negative.  Complete metabolic panel and CBC largely unremarkable.  Review of Systems: As per HPI, rest all negative.   Past Medical History:  Diagnosis Date  . B12 deficiency   . Depression   . Diabetes ([place])   . GERD (gastroesophageal reflux disease)   . Hypercholesteremia   . Hypertension   . Peripheral neuropathy   . Pre-diabetes   . Stroke [facility])     Past Surgical History:  Procedure Laterality Date  . CATARACT EXTRACTION Bilateral      reports that he has quit smoking. He has never used smokeless tobacco. He reports current alcohol use. He reports that he does not use drugs.  No Known Allergies  Family History  Problem Relation Age of Onset  . Hypertension Father   . Hypertension Mother     Prior to Admission medications   Medication Sig Start Date End Date Taking? Authorizing Provider  Alogliptin Benzoate 25 MG TABS Take 25 mg by mouth daily.   Yes [provider]  aspirin EC 81 MG EC tablet Take 1 tablet (81 mg total) by mouth daily. 06/25/19  Yes Donzetta Starch, NP    atorvastatin (LIPITOR) 20 MG tablet Take 1 tablet (20 mg total) by mouth daily at 6 PM. 06/24/19  Yes Donzetta Starch, NP  clopidogrel (PLAVIX) 75 MG tablet Take 1 tablet (75 mg total) by mouth daily. Take with aspirin x 3 weeks then stop Plavix and continue aspirin alone 06/25/19  Yes Biby, Sharon L, NP  FIBER PO Take 1 capsule by mouth daily.    Yes [provider]  insulin aspart (NOVOLOG FLEXPEN) 100 UNIT/ML FlexPen Inject 16 Units into the skin See admin instructions. Inject 16 units into the skin after breakfast and 16 units after supper   Yes [provider]  Insulin Glargine (LANTUS SOLOSTAR) 100 UNIT/ML Solostar Pen Inject 36 Units into the skin daily after breakfast.   Yes [provider]  losartan (COZAAR) 25 MG tablet Take 25 mg by mouth in the morning and at bedtime.  06/27/19  Yes [provider]  Omega-3 Fatty Acids (FISH OIL PO) Take 1 capsule by mouth daily.    Yes [provider]  omeprazole (PRILOSEC) 20 MG capsule Take 20 mg by mouth daily before breakfast.    Yes [provider]  triamcinolone cream (KENALOG) 0.1 % Apply 1 application topically See admin instructions. Apply 1 application to affected areas daily as needed for itching 06/25/19  Yes [provider]  vitamin B-12 (CYANOCOBALAMIN) 1000 MCG tablet Take 1,000 mcg by mouth  daily.   Yes [provider]  magnesium oxide (MAG-OX) 400 MG tablet Take 400 mg by mouth daily as needed (for constipation).     [provider]    Physical Exam: Constitutional: Moderately built and nourished. Vitals:   07/15/19 2000 07/15/19 2015 07/15/19 2037 07/15/19 2041  BP: (!) 180/79   (!) 190/72  Pulse: 66 63  65  Resp: (!) 24 18  18   Temp:    97.7 F (36.5 C)  TempSrc:    Oral  SpO2: 98% 99%  99%  Weight:   81.7 kg   Height:   5\' 10"  (1.778 m)    Eyes: Anicteric no pallor. ENMT: No discharge from the ears eyes nose or mouth. Neck: No mass felt.  No neck  rigidity. Respiratory: No rhonchi or crepitations. Cardiovascular: S1-S2 heard. Abdomen: Soft nontender bowel sounds present. Musculoskeletal: No edema. Skin: No rash. Neurologic: Alert awake oriented to his name and place moves all extremities 5 x 5 with no facial asymmetry tongue is midline. Psychiatric: Appears normal prolonged affect.   Labs on Admission: I have personally reviewed following labs and imaging studies  CBC: Recent Labs  Lab 07/15/19 1714 07/15/19 1720  WBC 9.4  --   NEUTROABS 6.0  --   HGB 13.1 13.3  HCT 40.4 39.0  MCV 91.6  --   PLT 299  --    Basic Metabolic Panel: Recent Labs  Lab 07/15/19 1714 07/15/19 1720  NA 140 141  K 3.6 3.6  CL 104 102  CO2 27  --   GLUCOSE 116* 112*  BUN 22 24*  CREATININE 1.04 1.00  CALCIUM 9.1  --    GFR: Estimated Creatinine Clearance: 53.7 mL/min (by C-G formula based on SCr of 1 mg/dL). Liver Function Tests: Recent Labs  Lab 07/15/19 1714  AST 24  ALT 21  ALKPHOS 62  BILITOT 0.6  PROT 7.2  ALBUMIN 3.6   No results for input(s): LIPASE, AMYLASE in the last 168 hours. No results for input(s): AMMONIA in the last 168 hours. Coagulation Profile: Recent Labs  Lab 07/15/19 1714  INR 1.0   Cardiac Enzymes: No results for input(s): CKTOTAL, CKMB, CKMBINDEX, TROPONINI in the last 168 hours. BNP (last 3 results) No results for input(s): PROBNP in the last 8760 hours. HbA1C: No results for input(s): HGBA1C in the last 72 hours. CBG: Recent Labs  Lab 07/15/19 1711 07/15/19 1846  GLUCAP 112* 107*   Lipid Profile: No results for input(s): CHOL, HDL, LDLCALC, TRIG, CHOLHDL, LDLDIRECT in the last 72 hours. Thyroid Function Tests: No results for input(s): TSH, T4TOTAL, FREET4, T3FREE, THYROIDAB in the last 72 hours. Anemia Panel: No results for input(s): VITAMINB12, FOLATE, FERRITIN, TIBC, IRON, RETICCTPCT in the last 72 hours. Urine analysis: No results found for: COLORURINE, APPEARANCEUR, LABSPEC,  PHURINE, GLUCOSEU, HGBUR, BILIRUBINUR, KETONESUR, PROTEINUR, UROBILINOGEN, NITRITE, LEUKOCYTESUR Sepsis Labs: @LABRCNTIP (procalcitonin:4,lacticidven:4) )No results found for this or any previous visit (from the past 240 hour(s)).   Radiological Exams on Admission: CT HEAD WO CONTRAST  Result Date: 07/15/2019 CLINICAL DATA:  Right visual field defect. Facial droop. EXAM: CT HEAD WITHOUT CONTRAST TECHNIQUE: Contiguous axial images were obtained from the base of the skull through the vertex without intravenous contrast. COMPARISON:  June 23, 2019. FINDINGS: Brain: Mild diffuse cortical atrophy is noted. Mild chronic ischemic white matter disease is noted. No mass effect or midline shift is noted. New wedge-shaped low density is noted involving the left occipital lobe concerning for acute infarction. No  hemorrhage or mass lesion is noted. Vascular: No hyperdense vessel or unexpected calcification. Skull: Normal. Negative for fracture or focal lesion. Sinuses/Orbits: No acute finding. Other: None. IMPRESSION: New wedge-shaped low density is noted involving the left occipital lobe concerning for acute infarction. MRI is recommended for further evaluation. Electronically Signed   By: Lupita Raider M.D.   On: 07/15/2019 18:14    EKG: Independently reviewed.  Normal sinus rhythm with RBBB.  Assessment/Plan Principal Problem:   Acute CVA (cerebrovascular accident) [facility]) Active Problems:   Hyperlipidemia   Diabetes mellitus type II, controlled (HCC)    1. Acute CVA -appreciate neurology consult.  Patient passed swallow.  CT angiogram of the head and neck has been ordered.  MRI brain is pending.  Patient has had recent stroke work-up last month.  Patient is on dual antiplatelet therapy which as per the neurologist to be continued.  On statins.  Physical therapy consult. 2. Diabetes mellitus type 2 on Lantus and NovoLog.  Closely monitor CBGs.  Medication dose confirmed with patient's  daughter. 3. Hypertension we will hold off ARB and allow for permissive hypertension as needed IV hydralazine. 4. Hyperlipidemia on statins.   DVT prophylaxis: Lovenox. Code Status: DNR confirmed with patient's daughter. Family Communication: Patient's daughter. Disposition Plan: Home.  Per daughter patient has SNF placement already. Consults called: Neurology. Admission status: Observation.   Eduard Clos MD [facility] Pager [phone].  If 7PM-7AM, please contact night-coverage [email or web address] Password [facility]  07/15/2019, 9:13 PM

## 2019-07-16 ENCOUNTER — Telehealth: Payer: Self-pay | Admitting: *Deleted

## 2019-07-16 ENCOUNTER — Observation Stay (HOSPITAL_COMMUNITY): Payer: Medicare Other

## 2019-07-16 DIAGNOSIS — F329 Major depressive disorder, single episode, unspecified: Secondary | ICD-10-CM | POA: Diagnosis present

## 2019-07-16 DIAGNOSIS — E1159 Type 2 diabetes mellitus with other circulatory complications: Secondary | ICD-10-CM | POA: Diagnosis not present

## 2019-07-16 DIAGNOSIS — Z8673 Personal history of transient ischemic attack (TIA), and cerebral infarction without residual deficits: Secondary | ICD-10-CM | POA: Diagnosis not present

## 2019-07-16 DIAGNOSIS — Z66 Do not resuscitate: Secondary | ICD-10-CM | POA: Diagnosis present

## 2019-07-16 DIAGNOSIS — Z20822 Contact with and (suspected) exposure to covid-19: Secondary | ICD-10-CM | POA: Diagnosis present

## 2019-07-16 DIAGNOSIS — I639 Cerebral infarction, unspecified: Secondary | ICD-10-CM | POA: Diagnosis not present

## 2019-07-16 DIAGNOSIS — Z7902 Long term (current) use of antithrombotics/antiplatelets: Secondary | ICD-10-CM | POA: Diagnosis not present

## 2019-07-16 DIAGNOSIS — Z79899 Other long term (current) drug therapy: Secondary | ICD-10-CM | POA: Diagnosis not present

## 2019-07-16 DIAGNOSIS — Z7982 Long term (current) use of aspirin: Secondary | ICD-10-CM | POA: Diagnosis not present

## 2019-07-16 DIAGNOSIS — H53461 Homonymous bilateral field defects, right side: Secondary | ICD-10-CM | POA: Diagnosis present

## 2019-07-16 DIAGNOSIS — Z87891 Personal history of nicotine dependence: Secondary | ICD-10-CM | POA: Diagnosis not present

## 2019-07-16 DIAGNOSIS — E7800 Pure hypercholesterolemia, unspecified: Secondary | ICD-10-CM | POA: Diagnosis present

## 2019-07-16 DIAGNOSIS — E785 Hyperlipidemia, unspecified: Secondary | ICD-10-CM | POA: Diagnosis present

## 2019-07-16 DIAGNOSIS — I10 Essential (primary) hypertension: Secondary | ICD-10-CM | POA: Diagnosis present

## 2019-07-16 DIAGNOSIS — R29701 NIHSS score 1: Secondary | ICD-10-CM | POA: Diagnosis present

## 2019-07-16 DIAGNOSIS — K219 Gastro-esophageal reflux disease without esophagitis: Secondary | ICD-10-CM | POA: Diagnosis present

## 2019-07-16 DIAGNOSIS — Z794 Long term (current) use of insulin: Secondary | ICD-10-CM | POA: Diagnosis not present

## 2019-07-16 DIAGNOSIS — E1142 Type 2 diabetes mellitus with diabetic polyneuropathy: Secondary | ICD-10-CM | POA: Diagnosis present

## 2019-07-16 DIAGNOSIS — E1151 Type 2 diabetes mellitus with diabetic peripheral angiopathy without gangrene: Secondary | ICD-10-CM | POA: Diagnosis present

## 2019-07-16 DIAGNOSIS — E538 Deficiency of other specified B group vitamins: Secondary | ICD-10-CM | POA: Diagnosis present

## 2019-07-16 DIAGNOSIS — R29810 Facial weakness: Secondary | ICD-10-CM | POA: Diagnosis present

## 2019-07-16 LAB — GLUCOSE, CAPILLARY
Glucose-Capillary: 89 mg/dL (ref 70–99)
Glucose-Capillary: 96 mg/dL (ref 70–99)
Glucose-Capillary: 158 mg/dL — ABNORMAL HIGH (ref 70–99)
Glucose-Capillary: 179 mg/dL — ABNORMAL HIGH (ref 70–99)
Glucose-Capillary: 135 mg/dL — ABNORMAL HIGH (ref 70–99)

## 2019-07-16 LAB — SARS CORONAVIRUS 2 (TAT 6-24 HRS): SARS Coronavirus 2: NEGATIVE

## 2019-07-16 NOTE — Progress Notes (Signed)
STROKE TEAM PROGRESS NOTE   INTERVAL HISTORY I have personally reviewed history of presenting illness with the patient, electronic medical records and imaging films in PACS.  Patient was recently admitted 2 weeks ago with slurred speech and right-sided weakness and received IV TPA and made complete recovery and MRI will negative for stroke at that time.  He now presents with right-sided peripheral vision difficulties and MRI does show left PCA infarct with CT angiogram showing high-grade left PCA stenosis which is unchanged from 2 weeks ago.  Patient does admit to snoring and does appear to be at high risk for sleep apnea but has not had any recent evaluation for the same.  His daughter is at the bedside.  Vitals:   07/16/19 0341 07/16/19 0400 07/16/19 0624 07/16/19 0913  BP: 130/60  (!) 169/75 (!) 154/75  Pulse: 60  87 66  Resp: 16 20 17 18   Temp: 97.6 F (36.4 C)  97.6 F (36.4 C) 98 F (36.7 C)  TempSrc: Oral  Oral Oral  SpO2: 96%  97% 99%  Weight:      Height:        CBC:  Recent Labs  Lab 07/15/19 1714 07/15/19 1720  WBC 9.4  --   NEUTROABS 6.0  --   HGB 13.1 13.3  HCT 40.4 39.0  MCV 91.6  --   PLT 299  --     Basic Metabolic Panel:  Recent Labs  Lab 07/15/19 1714 07/15/19 1720  NA 140 141  K 3.6 3.6  CL 104 102  CO2 27  --   GLUCOSE 116* 112*  BUN 22 24*  CREATININE 1.04 1.00  CALCIUM 9.1  --    Lipid Panel:     Component Value Date/Time   CHOL 175 06/24/2019 0200   TRIG 78 06/24/2019 0200   HDL 45 06/24/2019 0200   CHOLHDL 3.9 06/24/2019 0200   VLDL 16 06/24/2019 0200   LDLCALC 114 (H) 06/24/2019 0200   HgbA1c:  Lab Results  Component Value Date   HGBA1C 6.8 (H) 06/24/2019   IMAGING past 24 hours CT ANGIO HEAD W OR WO CONTRAST  Result Date: 07/15/2019 CLINICAL DATA:  Initial evaluation for visual changes over past 2 days. EXAM: CT ANGIOGRAPHY HEAD AND NECK TECHNIQUE: Multidetector CT imaging of the head and neck was performed using the standard  protocol during bolus administration of intravenous contrast. Multiplanar CT image reconstructions and MIPs were obtained to evaluate the vascular anatomy. Carotid stenosis measurements (when applicable) are obtained utilizing NASCET criteria, using the distal internal carotid diameter as the denominator. CONTRAST:  09/14/2019 OMNIPAQUE IOHEXOL 350 MG/ML SOLN COMPARISON:  Recent CTA from 06/23/2019. FINDINGS: CTA NECK FINDINGS Aortic arch: Visualized aortic arch of normal caliber with normal 3 vessel morphology. Mild atheromatous change about the origin of the great vessels without hemodynamically significant stenosis. Visualized subclavian arteries widely patent. Right carotid system: Right CCA patent from its origin to the bifurcation without hemodynamically significant stenosis. Soft plaque about the right carotid bifurcation with relatively mild 25-30% stenosis by NASCET criteria. Right ICA patent distally to the skull base without stenosis, dissection, or occlusion. Left carotid system: Left CCA patent from its origin to the bifurcation without stenosis. Luminal irregularity with soft plaque about the left bifurcation and proximal left ICA without significant stenosis. Left ICA widely patent distally to the skull base without dissection or occlusion. Vertebral arteries: Both vertebral arteries arise from the subclavian arteries. Left vertebral artery dominant. Multifocal atheromatous irregularity about the right  V1 and V2 segments without high-grade stenosis. Vertebral arteries otherwise widely patent within the neck. Skeleton: No acute osseous abnormality. No discrete lytic or blastic osseous lesions. Patient is edentulous. Mild multilevel cervical spondylosis without significant stenosis. Other neck: 8 mm hypodense left thyroid nodule, for which no follow-up imaging is recommended. No other mass lesion or adenopathy. Few scattered calcified tonsilliths noted within the left palatine tonsil. Upper chest: Visualized  upper chest demonstrates no acute finding. Review of the MIP images confirms the above findings CTA HEAD FINDINGS Anterior circulation: Petrous segments patent bilaterally. Mild atheromatous change within the cavernous/supraclinoid ICAs without significant stenosis. A1 segments patent bilaterally. Normal anterior communicating artery. Short-segment severe left A2 stenosis noted, stable. Right ACA widely patent to its distal aspect without abnormality. Irregular mild multifocal narrowing of the distal left M1 segment. Normal left MCA bifurcation. Diffuse small vessel atheromatous irregularity. Right M1 widely patent. Normal right MCA bifurcation. Moderate to advanced proximal right M2 stenoses noted. Diffuse small vessel atheromatous irregularity. Posterior circulation: Mild atheromatous irregularity within the right V4 segment without significant stenosis. Neither PICA of visualized. Focal non stenotic plaque noted at the vertebrobasilar junction. Basilar widely patent distally to its distal aspect without flow-limiting stenosis. Superior cerebral arteries patent bilaterally. Both PCAs primarily supplied via the basilar. Short-segment severe left P1/P2 stenosis again noted, stable. PCAs otherwise widely patent to their distal aspects. Posterior communicating arteries not well seen, and may be hypoplastic and/or absent. Venous sinuses: Patent. Anatomic variants: None significant.  No intracranial aneurysm. Review of the MIP images confirms the above findings IMPRESSION: 1. Stable CTA of the head and neck as compared to 06/23/2019. No large vessel occlusion or new vascular finding. 2. Intracranial atherosclerosis with associated severe proximal left PCA and left A2 stenoses, stable. 3. Atherosclerotic change about the carotid bifurcations without high-grade stenosis. 4. Patent vertebral arteries within the neck with mild multifocal irregularity and stenoses involving the proximal right vertebral artery. Electronically  Signed   By: Rise Mu M.D.   On: 07/15/2019 22:54   CT HEAD WO CONTRAST  Result Date: 07/15/2019 CLINICAL DATA:  Right visual field defect. Facial droop. EXAM: CT HEAD WITHOUT CONTRAST TECHNIQUE: Contiguous axial images were obtained from the base of the skull through the vertex without intravenous contrast. COMPARISON:  June 23, 2019. FINDINGS: Brain: Mild diffuse cortical atrophy is noted. Mild chronic ischemic white matter disease is noted. No mass effect or midline shift is noted. New wedge-shaped low density is noted involving the left occipital lobe concerning for acute infarction. No hemorrhage or mass lesion is noted. Vascular: No hyperdense vessel or unexpected calcification. Skull: Normal. Negative for fracture or focal lesion. Sinuses/Orbits: No acute finding. Other: None. IMPRESSION: New wedge-shaped low density is noted involving the left occipital lobe concerning for acute infarction. MRI is recommended for further evaluation. Electronically Signed   By: Lupita Raider M.D.   On: 07/15/2019 18:14   CT ANGIO NECK W OR WO CONTRAST  Result Date: 07/15/2019 CLINICAL DATA:  Initial evaluation for visual changes over past 2 days. EXAM: CT ANGIOGRAPHY HEAD AND NECK TECHNIQUE: Multidetector CT imaging of the head and neck was performed using the standard protocol during bolus administration of intravenous contrast. Multiplanar CT image reconstructions and MIPs were obtained to evaluate the vascular anatomy. Carotid stenosis measurements (when applicable) are obtained utilizing NASCET criteria, using the distal internal carotid diameter as the denominator. CONTRAST:  OMNIPAQUE IOHEXOL 350 MG/ML SOLN COMPARISON:  Recent CTA from 06/23/2019. FINDINGS: CTA NECK FINDINGS  Aortic arch: Visualized aortic arch of normal caliber with normal 3 vessel morphology. Mild atheromatous change about the origin of the great vessels without hemodynamically significant stenosis. Visualized subclavian  arteries widely patent. Right carotid system: Right CCA patent from its origin to the bifurcation without hemodynamically significant stenosis. Soft plaque about the right carotid bifurcation with relatively mild 25-30% stenosis by NASCET criteria. Right ICA patent distally to the skull base without stenosis, dissection, or occlusion. Left carotid system: Left CCA patent from its origin to the bifurcation without stenosis. Luminal irregularity with soft plaque about the left bifurcation and proximal left ICA without significant stenosis. Left ICA widely patent distally to the skull base without dissection or occlusion. Vertebral arteries: Both vertebral arteries arise from the subclavian arteries. Left vertebral artery dominant. Multifocal atheromatous irregularity about the right V1 and V2 segments without high-grade stenosis. Vertebral arteries otherwise widely patent within the neck. Skeleton: No acute osseous abnormality. No discrete lytic or blastic osseous lesions. Patient is edentulous. Mild multilevel cervical spondylosis without significant stenosis. Other neck: 8 mm hypodense left thyroid nodule, for which no follow-up imaging is recommended. No other mass lesion or adenopathy. Few scattered calcified tonsilliths noted within the left palatine tonsil. Upper chest: Visualized upper chest demonstrates no acute finding. Review of the MIP images confirms the above findings CTA HEAD FINDINGS Anterior circulation: Petrous segments patent bilaterally. Mild atheromatous change within the cavernous/supraclinoid ICAs without significant stenosis. A1 segments patent bilaterally. Normal anterior communicating artery. Short-segment severe left A2 stenosis noted, stable. Right ACA widely patent to its distal aspect without abnormality. Irregular mild multifocal narrowing of the distal left M1 segment. Normal left MCA bifurcation. Diffuse small vessel atheromatous irregularity. Right M1 widely patent. Normal right MCA  bifurcation. Moderate to advanced proximal right M2 stenoses noted. Diffuse small vessel atheromatous irregularity. Posterior circulation: Mild atheromatous irregularity within the right V4 segment without significant stenosis. Neither PICA of visualized. Focal non stenotic plaque noted at the vertebrobasilar junction. Basilar widely patent distally to its distal aspect without flow-limiting stenosis. Superior cerebral arteries patent bilaterally. Both PCAs primarily supplied via the basilar. Short-segment severe left P1/P2 stenosis again noted, stable. PCAs otherwise widely patent to their distal aspects. Posterior communicating arteries not well seen, and may be hypoplastic and/or absent. Venous sinuses: Patent. Anatomic variants: None significant.  No intracranial aneurysm. Review of the MIP images confirms the above findings IMPRESSION: 1. Stable CTA of the head and neck as compared to 06/23/2019. No large vessel occlusion or new vascular finding. 2. Intracranial atherosclerosis with associated severe proximal left PCA and left A2 stenoses, stable. 3. Atherosclerotic change about the carotid bifurcations without high-grade stenosis. 4. Patent vertebral arteries within the neck with mild multifocal irregularity and stenoses involving the proximal right vertebral artery. Electronically Signed   By: Rise Mu M.D.   On: 07/15/2019 22:54   MR BRAIN WO CONTRAST  Result Date: 07/16/2019 CLINICAL DATA:  Blurred vision in the right eye. EXAM: MRI HEAD WITHOUT CONTRAST TECHNIQUE: Multiplanar, multiecho pulse sequences of the brain and surrounding structures were obtained without intravenous contrast. COMPARISON:  MR head without contrast 06/24/2019. CT head and CTA head 07/15/2019. FINDINGS: Brain: Diffusion weighted images confirm an acute infarct in the inferior left occipital lobe. Additional smaller cortical infarcts are present more superiorly in the medial left occipital lobe. No acute hemorrhage mass  lesion is present. T2 signal changes are associated with the area of acute/subacute infarction. Atrophy and white matter disease is otherwise stable. The ventricles are proportionate to  the degree of atrophy. White matter changes extend into the brainstem. Remote lacunar infarct is present in the left cerebellum. No significant extraaxial fluid collection is present. Vascular: Flow is present in the major intracranial arteries. Skull and upper cervical spine: The craniocervical junction is normal. Upper cervical spine is within normal limits. Marrow signal is unremarkable. Sinuses/Orbits: The paranasal sinuses and mastoid air cells are clear. Bilateral lens replacements are noted. Globes and orbits are otherwise unremarkable. IMPRESSION: 1. Acute/subacute nonhemorrhagic infarcts of the left occipital lobe as seen by CT. 2. Additional smaller cortical nonhemorrhagic infarcts are acute/subacute in the more superior and medial left occipital lobe. 3. Advanced atrophy and white matter disease is otherwise stable likely reflecting the sequela of chronic microvascular ischemia. Electronically Signed   By: San Morelle M.D.   On: 07/16/2019 07:59    PHYSICAL EXAM Mildly obese elderly Caucasian male not in distress. . Afebrile. Head is nontraumatic. Neck is supple without bruit.    Cardiac exam no murmur or gallop. Lungs are clear to auscultation. Distal pulses are well felt. Neurological Exam ;  Awake  Alert oriented x 3. Normal speech and language.eye movements full without nystagmus.fundi were not visualized. Vision acuity   appear normal.  Partial right-sided peripheral field loss hearing is diminished bilaterally. Palatal movements are normal. Face symmetric. Tongue midline. Normal strength, tone, reflexes and coordination. Normal sensation. Gait deferred. NIH score of 1.  Premorbid modified Rankin score 0  ASSESSMENT/PLAN Joseph Arellano is a 86 y.o. male with history of diabetes, B12 deficiency,  hypercholesterolemia, hypertension treated here for stroke symptoms w/ L brain TIA s/p tPA 2 weeks ago found to have asymptomatic severe L PCA stenosis at that time  presenting with R vision field loss.   Stroke:  L occipital lobe infarcts in setting of severe L PCA stenosis, secondary to large vessel disease source  CT head new wedge shaped L occipital lobe hypodensity, possible infarct   CTA head & neck no LVO or new finding. Severe proximal L PCA and L A2 stenosis. B ICA bifurcation atherosclerosis. B VA w/ mild irregularity and pros R VA stenosis   MRI  L occipital lobe infarcts. Small cortical acute/subacute infarcts superior and medial L occipital lobe. Advanced atrophy. Small vessel disease.   LDL 114  HgbA1c 6.8  Lovenox 40 mg sq daily for VTE prophylaxis  aspirin 81 mg daily and clopidogrel 75 mg daily prior to admission, now on aspirin 81 mg daily and clopidogrel 75 mg daily. Given large vessel disease, continue DAPT x 3 months then ASPIRIN alone  Therapy recommendations:  No PT, no SLP,   Disposition:  Return home  No need for repeat stroke workup  Currently undergoing OP cardiac evaluation for atrial fibrillation - he will f/u w/ cardiology  Patient advised not to drive until vision improves  Hypertension  Stable . Permissive hypertension (OK if < 220/120) but gradually normalize in 5-7 days . Long-term BP goal 130-150  Hyperlipidemia  Home meds:  lipitor 20 & colestid, fish oil, resumed in hospital  LDL 114, goal < 70  Continue statin at discharge  Diabetes type II Controlled  HgbA1c 6.8, goal < 7.0  Other Stroke Risk Factors  Advanced age  Former Cigarette smoker  ETOH use, advised to drink no more than 2 drink(s) a day  Hx TIA - 06/23/2019 - L brain TIA s/p tPA w/ neg MRI. Presented w/ ataxia, clumsiness and R sided weakness  Other Active Problems  Hx peripheral neuropathy,  seen by Dr. Arbutus Leas in 2015  Hx B12 deficiency on monthly injections and  oral replacement  GERD on celexa  Depression on PPI  Hospital day # 0 He presented with right-sided peripheral vision difficulties due to left PCA infarct likely symptomatic from high-grade left PCA stenosis.  He also appears to have body habitus to be at high risk for sleep apnea.  Is interested in participating in the sleep smart trial and will be given information to review and decide.  Recommend aspirin and Plavix for 3 months followed by Plavix alone.  Aggressive risk factor modification.  Long discussion with patient and daughter as well as with Dr. Daleen Bo and answered questions.  Greater than 50% time during this 35-minute visit was spent on counseling and coordination of care about his stroke and vision loss and discussion about sleep apnea and answering questions. Delia Heady, MD To contact Stroke Continuity provider, please refer to [email or web address]. After hours, contact General Neurology

## 2019-07-16 NOTE — Telephone Encounter (Signed)
Patient is expected to be discharged tomorrow.  Explained to [name] to reapply his cardiac event monitor once he is discharged.  She will need to turn on his cell phone and apply monitor with a new strip.  Turn monitor on and the select monitor placement once prompted.  Judy Pimple to call [employer] to confim monitor is transmitting once she reapplies the moniitor.

## 2019-07-16 NOTE — Telephone Encounter (Signed)
[name] the patient's daughter is calling to inform Joseph Arellano that [name] Term Monitor was removed last night at the ED. She states Joseph Arellano had a stroke, but she is hoping he will be released today or tomorrow. [name] would like to know how they should proceed with this once he is released. Please advise.

## 2019-07-16 NOTE — Progress Notes (Signed)
PROGRESS NOTE    Joseph Arellano  RFX:[id number] DOB: 09-Oct-1929 DOA: 07/15/2019 PCP: Adrian Prince, MD   Brief Narrative:  HPI: Joseph Arellano is a 86 y.o. male with history of recent admission for TIA status post TPA discharged on June 24, 2019 3 weeks ago with history of diabetes mellitus, hypertension B12 deficiency started experiencing blurry vision in the right eye over the last 3 days which patient reported to her started this morning.  Patient was brought to the ER.  Denies any weakness of upper or lower extremity.  Denies any difficulty speaking or swallowing.  ED Course: In the ER CT head shows infarct involving the left occipital area.  Neurologist on-call was consulted.  Patient passed swallow.  Patient on exam is able to move all extremities without difficulty.  EKG shows normal sinus rhythm.  Covid test was negative.  Complete metabolic panel and CBC largely unremarkable.  Assessment & Plan:   Principal Problem:   Acute CVA (cerebrovascular accident) [facility]) Active Problems:   Hyperlipidemia   Diabetes mellitus type II, controlled (HCC)   Acute ischemic stroke (HCC)  Acute/subacute nonhemorrhagic ischemic infarct of left occipital lobe: Patient feels much better.  No focal neurological deficit except some vision issues.  Per my discussion with Dr. Pearlean Brownie from neurology, he recommends continuing aspirin and Plavix for 3 months and then Plavix alone.  Patient does have severe proximal left PCA and left A2 stenosis and atherosclerotic change about the bifurcation of carotid without high-grade stenosis.  Per Dr. Pearlean Brownie, he does not need any further intervention, cerebral angiogram or stents.  He was seen by PT OT and cleared to go home.  ?  Sleep apnea: Neurology would like for him to participate in sleep smart study and would like to keep him in the hospital overnight.  Talked to patient's daughter and she tells me that patient is agreeable for that.  Type 2 diabetes mellitus:  Blood sugar fairly controlled.  Continue current dose of Lantus and SSI.  Essential hypertension: Blood pressure fairly controlled.  In an effort to allow permissive hypertension, will continue to hold antihypertensives.  Hyperlipidemia: Continue atorvastatin  DVT prophylaxis: SCD Code Status: DNR Family Communication:  None present at bedside.  Plan of care discussed with patient in length and he verbalized understanding and agreed with it.  Later discussed with daughter over the phone as well. Patient is from: Home Disposition Plan: Home likely tomorrow Barriers to discharge: Neurology recommends keeping in the hospital for sleep smart study overnight   Estimated body mass index is 25.84 kg/m as calculated from the following:   Height as of this encounter: 5\' 10"  (1.778 m).   Weight as of this encounter: 81.7 kg.      Nutritional status:               Consultants:   Neurology  Procedures:   None  Antimicrobials:   None   Subjective: Seen and examined.  He has no complaints.  Objective: Vitals:   07/16/19 0400 07/16/19 0624 07/16/19 0913 07/16/19 1205  BP:  (!) 169/75 (!) 154/75 137/63  Pulse:  87 66 71  Resp: 20 17 18 18   Temp:  97.6 F (36.4 C) 98 F (36.7 C) 98.3 F (36.8 C)  TempSrc:  Oral Oral Oral  SpO2:  97% 99% 94%  Weight:      Height:        Intake/Output Summary (Last 24 hours) at 07/16/2019 1326 Last data filed at 07/16/2019  0900 Gross per 24 hour  Intake 867.17 ml  Output 750 ml  Net 117.17 ml   Filed Weights   07/15/19 1710 07/15/19 2037  Weight: 87.5 kg 81.7 kg    Examination:  General exam: Appears calm and comfortable  Respiratory system: Clear to auscultation. Respiratory effort normal. Cardiovascular system: S1 & S2 heard, RRR. No JVD, murmurs, rubs, gallops or clicks. No pedal edema. Gastrointestinal system: Abdomen is nondistended, soft and nontender. No organomegaly or masses felt. Normal bowel sounds  heard. Central nervous system: Alert and oriented. No focal neurological deficits. Extremities: Symmetric 5 x 5 power. Skin: No rashes, lesions or ulcers Psychiatry: Judgement and insight appear normal. Mood & affect appropriate.    Data Reviewed: I have personally reviewed following labs and imaging studies  CBC: Recent Labs  Lab 07/15/19 1714 07/15/19 1720  WBC 9.4  --   NEUTROABS 6.0  --   HGB 13.1 13.3  HCT 40.4 39.0  MCV 91.6  --   PLT 299  --    Basic Metabolic Panel: Recent Labs  Lab 07/15/19 1714 07/15/19 1720  NA 140 141  K 3.6 3.6  CL 104 102  CO2 27  --   GLUCOSE 116* 112*  BUN 22 24*  CREATININE 1.04 1.00  CALCIUM 9.1  --    GFR: Estimated Creatinine Clearance: 53.7 mL/min (by C-G formula based on SCr of 1 mg/dL). Liver Function Tests: Recent Labs  Lab 07/15/19 1714  AST 24  ALT 21  ALKPHOS 62  BILITOT 0.6  PROT 7.2  ALBUMIN 3.6   No results for input(s): LIPASE, AMYLASE in the last 168 hours. No results for input(s): AMMONIA in the last 168 hours. Coagulation Profile: Recent Labs  Lab 07/15/19 1714  INR 1.0   Cardiac Enzymes: No results for input(s): CKTOTAL, CKMB, CKMBINDEX, TROPONINI in the last 168 hours. BNP (last 3 results) No results for input(s): PROBNP in the last 8760 hours. HbA1C: No results for input(s): HGBA1C in the last 72 hours. CBG: Recent Labs  Lab 07/15/19 1846 07/15/19 2208 07/16/19 0613 07/16/19 0855 07/16/19 1203  GLUCAP 107* 68* 89 96 158*   Lipid Profile: No results for input(s): CHOL, HDL, LDLCALC, TRIG, CHOLHDL, LDLDIRECT in the last 72 hours. Thyroid Function Tests: No results for input(s): TSH, T4TOTAL, FREET4, T3FREE, THYROIDAB in the last 72 hours. Anemia Panel: No results for input(s): VITAMINB12, FOLATE, FERRITIN, TIBC, IRON, RETICCTPCT in the last 72 hours. Sepsis Labs: No results for input(s): PROCALCITON, LATICACIDVEN in the last 168 hours.  Recent Results (from the past 240 hour(s))  SARS  CORONAVIRUS 2 (TAT 6-24 HRS) Nasopharyngeal Nasopharyngeal Swab     Status: None   Collection Time: 07/15/19  7:19 PM   Specimen: Nasopharyngeal Swab  Result Value Ref Range Status   SARS Coronavirus 2 NEGATIVE NEGATIVE Final    Comment: (NOTE) SARS-CoV-2 target nucleic acids are NOT DETECTED. The SARS-CoV-2 RNA is generally detectable in upper and lower respiratory specimens during the acute phase of infection. Negative results do not preclude SARS-CoV-2 infection, do not rule out co-infections with other pathogens, and should not be used as the sole basis for treatment or other patient management decisions. Negative results must be combined with clinical observations, patient history, and epidemiological information. The expected result is Negative. Fact Sheet for Patients: HairSlick.no Fact Sheet for Healthcare Providers: quierodirigir.com This test is not yet approved or cleared by the [place] FDA and  has been authorized for detection and/or diagnosis of SARS-CoV-2 by  FDA under an Emergency Use Authorization (EUA). This EUA will remain  in effect (meaning this test can be used) for the duration of the COVID-19 declaration under Section 56 4(b)(1) of the Act, 21 U.S.C. section 360bbb-3(b)(1), unless the authorization is terminated or revoked sooner. Performed at [facility], [address], [place], [place] [address]       Radiology Studies: CT ANGIO HEAD W OR WO CONTRAST  Result Date: 07/15/2019 CLINICAL DATA:  Initial evaluation for visual changes over past 2 days. EXAM: CT ANGIOGRAPHY HEAD AND NECK TECHNIQUE: Multidetector CT imaging of the head and neck was performed using the standard protocol during bolus administration of intravenous contrast. Multiplanar CT image reconstructions and MIPs were obtained to evaluate the vascular anatomy. Carotid stenosis measurements (when applicable) are obtained utilizing  NASCET criteria, using the distal internal carotid diameter as the denominator. CONTRAST:  OMNIPAQUE IOHEXOL 350 MG/ML SOLN COMPARISON:  Recent CTA from 06/23/2019. FINDINGS: CTA NECK FINDINGS Aortic arch: Visualized aortic arch of normal caliber with normal 3 vessel morphology. Mild atheromatous change about the origin of the great vessels without hemodynamically significant stenosis. Visualized subclavian arteries widely patent. Right carotid system: Right CCA patent from its origin to the bifurcation without hemodynamically significant stenosis. Soft plaque about the right carotid bifurcation with relatively mild 25-30% stenosis by NASCET criteria. Right ICA patent distally to the skull base without stenosis, dissection, or occlusion. Left carotid system: Left CCA patent from its origin to the bifurcation without stenosis. Luminal irregularity with soft plaque about the left bifurcation and proximal left ICA without significant stenosis. Left ICA widely patent distally to the skull base without dissection or occlusion. Vertebral arteries: Both vertebral arteries arise from the subclavian arteries. Left vertebral artery dominant. Multifocal atheromatous irregularity about the right V1 and V2 segments without high-grade stenosis. Vertebral arteries otherwise widely patent within the neck. Skeleton: No acute osseous abnormality. No discrete lytic or blastic osseous lesions. Patient is edentulous. Mild multilevel cervical spondylosis without significant stenosis. Other neck: 8 mm hypodense left thyroid nodule, for which no follow-up imaging is recommended. No other mass lesion or adenopathy. Few scattered calcified tonsilliths noted within the left palatine tonsil. Upper chest: Visualized upper chest demonstrates no acute finding. Review of the MIP images confirms the above findings CTA HEAD FINDINGS Anterior circulation: Petrous segments patent bilaterally. Mild atheromatous change within the  cavernous/supraclinoid ICAs without significant stenosis. A1 segments patent bilaterally. Normal anterior communicating artery. Short-segment severe left A2 stenosis noted, stable. Right ACA widely patent to its distal aspect without abnormality. Irregular mild multifocal narrowing of the distal left M1 segment. Normal left MCA bifurcation. Diffuse small vessel atheromatous irregularity. Right M1 widely patent. Normal right MCA bifurcation. Moderate to advanced proximal right M2 stenoses noted. Diffuse small vessel atheromatous irregularity. Posterior circulation: Mild atheromatous irregularity within the right V4 segment without significant stenosis. Neither PICA of visualized. Focal non stenotic plaque noted at the vertebrobasilar junction. Basilar widely patent distally to its distal aspect without flow-limiting stenosis. Superior cerebral arteries patent bilaterally. Both PCAs primarily supplied via the basilar. Short-segment severe left P1/P2 stenosis again noted, stable. PCAs otherwise widely patent to their distal aspects. Posterior communicating arteries not well seen, and may be hypoplastic and/or absent. Venous sinuses: Patent. Anatomic variants: None significant.  No intracranial aneurysm. Review of the MIP images confirms the above findings IMPRESSION: 1. Stable CTA of the head and neck as compared to 06/23/2019. No large vessel occlusion or new vascular finding. 2. Intracranial atherosclerosis with  associated severe proximal left PCA and left A2 stenoses, stable. 3. Atherosclerotic change about the carotid bifurcations without high-grade stenosis. 4. Patent vertebral arteries within the neck with mild multifocal irregularity and stenoses involving the proximal right vertebral artery. Electronically Signed   By: Rise Mu M.D.   On: 07/15/2019 22:54   CT HEAD WO CONTRAST  Result Date: 07/15/2019 CLINICAL DATA:  Right visual field defect. Facial droop. EXAM: CT HEAD WITHOUT CONTRAST  TECHNIQUE: Contiguous axial images were obtained from the base of the skull through the vertex without intravenous contrast. COMPARISON:  June 23, 2019. FINDINGS: Brain: Mild diffuse cortical atrophy is noted. Mild chronic ischemic white matter disease is noted. No mass effect or midline shift is noted. New wedge-shaped low density is noted involving the left occipital lobe concerning for acute infarction. No hemorrhage or mass lesion is noted. Vascular: No hyperdense vessel or unexpected calcification. Skull: Normal. Negative for fracture or focal lesion. Sinuses/Orbits: No acute finding. Other: None. IMPRESSION: New wedge-shaped low density is noted involving the left occipital lobe concerning for acute infarction. MRI is recommended for further evaluation. Electronically Signed   By: Lupita Raider M.D.   On: 07/15/2019 18:14   CT ANGIO NECK W OR WO CONTRAST  Result Date: 07/15/2019 CLINICAL DATA:  Initial evaluation for visual changes over past 2 days. EXAM: CT ANGIOGRAPHY HEAD AND NECK TECHNIQUE: Multidetector CT imaging of the head and neck was performed using the standard protocol during bolus administration of intravenous contrast. Multiplanar CT image reconstructions and MIPs were obtained to evaluate the vascular anatomy. Carotid stenosis measurements (when applicable) are obtained utilizing NASCET criteria, using the distal internal carotid diameter as the denominator. CONTRAST:  OMNIPAQUE IOHEXOL 350 MG/ML SOLN COMPARISON:  Recent CTA from 06/23/2019. FINDINGS: CTA NECK FINDINGS Aortic arch: Visualized aortic arch of normal caliber with normal 3 vessel morphology. Mild atheromatous change about the origin of the great vessels without hemodynamically significant stenosis. Visualized subclavian arteries widely patent. Right carotid system: Right CCA patent from its origin to the bifurcation without hemodynamically significant stenosis. Soft plaque about the right carotid bifurcation with  relatively mild 25-30% stenosis by NASCET criteria. Right ICA patent distally to the skull base without stenosis, dissection, or occlusion. Left carotid system: Left CCA patent from its origin to the bifurcation without stenosis. Luminal irregularity with soft plaque about the left bifurcation and proximal left ICA without significant stenosis. Left ICA widely patent distally to the skull base without dissection or occlusion. Vertebral arteries: Both vertebral arteries arise from the subclavian arteries. Left vertebral artery dominant. Multifocal atheromatous irregularity about the right V1 and V2 segments without high-grade stenosis. Vertebral arteries otherwise widely patent within the neck. Skeleton: No acute osseous abnormality. No discrete lytic or blastic osseous lesions. Patient is edentulous. Mild multilevel cervical spondylosis without significant stenosis. Other neck: 8 mm hypodense left thyroid nodule, for which no follow-up imaging is recommended. No other mass lesion or adenopathy. Few scattered calcified tonsilliths noted within the left palatine tonsil. Upper chest: Visualized upper chest demonstrates no acute finding. Review of the MIP images confirms the above findings CTA HEAD FINDINGS Anterior circulation: Petrous segments patent bilaterally. Mild atheromatous change within the cavernous/supraclinoid ICAs without significant stenosis. A1 segments patent bilaterally. Normal anterior communicating artery. Short-segment severe left A2 stenosis noted, stable. Right ACA widely patent to its distal aspect without abnormality. Irregular mild multifocal narrowing of the distal left M1 segment. Normal left MCA bifurcation. Diffuse small vessel atheromatous irregularity. Right M1 widely patent.  Normal right MCA bifurcation. Moderate to advanced proximal right M2 stenoses noted. Diffuse small vessel atheromatous irregularity. Posterior circulation: Mild atheromatous irregularity within the right V4 segment  without significant stenosis. Neither PICA of visualized. Focal non stenotic plaque noted at the vertebrobasilar junction. Basilar widely patent distally to its distal aspect without flow-limiting stenosis. Superior cerebral arteries patent bilaterally. Both PCAs primarily supplied via the basilar. Short-segment severe left P1/P2 stenosis again noted, stable. PCAs otherwise widely patent to their distal aspects. Posterior communicating arteries not well seen, and may be hypoplastic and/or absent. Venous sinuses: Patent. Anatomic variants: None significant.  No intracranial aneurysm. Review of the MIP images confirms the above findings IMPRESSION: 1. Stable CTA of the head and neck as compared to 06/23/2019. No large vessel occlusion or new vascular finding. 2. Intracranial atherosclerosis with associated severe proximal left PCA and left A2 stenoses, stable. 3. Atherosclerotic change about the carotid bifurcations without high-grade stenosis. 4. Patent vertebral arteries within the neck with mild multifocal irregularity and stenoses involving the proximal right vertebral artery. Electronically Signed   By: Jeannine Boga M.D.   On: 07/15/2019 22:54   MR BRAIN WO CONTRAST  Result Date: 07/16/2019 CLINICAL DATA:  Blurred vision in the right eye. EXAM: MRI HEAD WITHOUT CONTRAST TECHNIQUE: Multiplanar, multiecho pulse sequences of the brain and surrounding structures were obtained without intravenous contrast. COMPARISON:  MR head without contrast 06/24/2019. CT head and CTA head 07/15/2019. FINDINGS: Brain: Diffusion weighted images confirm an acute infarct in the inferior left occipital lobe. Additional smaller cortical infarcts are present more superiorly in the medial left occipital lobe. No acute hemorrhage mass lesion is present. T2 signal changes are associated with the area of acute/subacute infarction. Atrophy and white matter disease is otherwise stable. The ventricles are proportionate to the degree  of atrophy. White matter changes extend into the brainstem. Remote lacunar infarct is present in the left cerebellum. No significant extraaxial fluid collection is present. Vascular: Flow is present in the major intracranial arteries. Skull and upper cervical spine: The craniocervical junction is normal. Upper cervical spine is within normal limits. Marrow signal is unremarkable. Sinuses/Orbits: The paranasal sinuses and mastoid air cells are clear. Bilateral lens replacements are noted. Globes and orbits are otherwise unremarkable. IMPRESSION: 1. Acute/subacute nonhemorrhagic infarcts of the left occipital lobe as seen by CT. 2. Additional smaller cortical nonhemorrhagic infarcts are acute/subacute in the more superior and medial left occipital lobe. 3. Advanced atrophy and white matter disease is otherwise stable likely reflecting the sequela of chronic microvascular ischemia. Electronically Signed   By: San Morelle M.D.   On: 07/16/2019 07:59    Scheduled Meds: . aspirin EC  81 mg Oral Daily  . atorvastatin  20 mg Oral q1800  . clopidogrel  75 mg Oral Daily  . enoxaparin (LOVENOX) injection  40 mg Subcutaneous Q24H  . insulin aspart  0-6 Units Subcutaneous TID WC  . insulin glargine  36 Units Subcutaneous QPC breakfast  . omega-3 acid ethyl esters  1 g Oral Daily  . pantoprazole  40 mg Oral Daily  . vitamin B-12  1,000 mcg Oral Daily   Continuous Infusions: . sodium chloride Stopped (07/16/19 0626)     LOS: 0 days   Time spent: 35 minutes   Darliss Cheney, MD [facility]  07/16/2019, 1:26 PM   To contact the attending provider between 7A-7P or the covering provider during after hours 7P-7A, please log into the web site [email or web address].

## 2019-07-16 NOTE — Evaluation (Addendum)
Occupational Therapy Evaluation Patient Details Name: Joseph Arellano MRN: [id number] DOB: August 25, 1929 Today's Date: 07/16/2019    History of Present Illness  VADEN BECHERER is a 86 y.o. male with a history of diabetes, B12 deficiency, hypercholesterolemia, hypertension who presents with visual changes. MRI positive for left occipital stroke.   Clinical Impression   Pt admitted with the above diagnoses and presents with below problem list. Pt will benefit from continued acute OT to address the below listed deficits and maximize independence with basic ADLs prior to d/c home. PTA pt was independent with ADLs. Pt moving into ILF this month. Pt currently setup to supervision with ADLs for safety due to visual field deficits. Recommended to pt no driving and to have help with setting up medications. Pt reports he is planning to sell his car and per conversations with daughter and pt, assist with medication management can be arranged. Pt with good awareness of deficits though does ambulate at an increased speed, unsure if this is baseline but did suggest to pt moderating his speed a bit with new visual field deficits.      Follow Up Recommendations  Home health OT    Equipment Recommendations  None recommended by OT    Recommendations for Other Services Other (comment)(OP f/u with opthamologist )     Precautions / Restrictions Precautions Precautions: Other (comment) Precaution Comments: R homonymous hemianopsia Restrictions Weight Bearing Restrictions: No      Mobility Bed Mobility Overal bed mobility: Independent                Transfers Overall transfer level: Independent Equipment used: None             General transfer comment: stood safely without use of AD    Balance Overall balance assessment: Mild deficits observed, not formally tested                                         ADL either performed or assessed with clinical judgement   ADL  Overall ADL's : Needs assistance/impaired Eating/Feeding: Set up;Sitting   Grooming: Set up;Sitting;Supervision/safety   Upper Body Bathing: Set up;Sitting;Supervision/ safety   Lower Body Bathing: Min guard;Sit to/from stand   Upper Body Dressing : Set up;Supervision/safety;Sitting   Lower Body Dressing: Min guard;Sit to/from stand   Toilet Transfer: Min guard   Toileting- Architect and Hygiene: Min guard;Sit to/from stand   Tub/ Shower Transfer: Min guard   Functional mobility during ADLs: Min guard General ADL Comments: Pt walks with increased speed (baseline?). Naviagated around large obstacles in room with min guard assist for safety. Education on compoensatory strategies for visual field deficits and safety at home. Recommended no driving and setup assist with medications.     Vision Baseline Vision/History: Wears glasses Wears Glasses: Reading only Patient Visual Report: Blurring of vision;Other (comment)(pt with good awareness of field cuts) Vision Assessment?: Yes Eye Alignment: Within Functional Limits Ocular Range of Motion: Within Functional Limits Alignment/Gaze Preference: Within Defined Limits Visual Fields: Right homonymous hemianopsia Additional Comments: Pt with good awareness of visal field defciti.     Perception     Praxis      Pertinent Vitals/Pain Pain Assessment: No/denies pain     Hand Dominance Right   Extremity/Trunk Assessment Upper Extremity Assessment Upper Extremity Assessment: Overall WFL for tasks assessed   Lower Extremity Assessment Lower Extremity Assessment: Defer to PT  evaluation   Cervical / Trunk Assessment Cervical / Trunk Assessment: Normal   Communication Communication Communication: No difficulties   Cognition Arousal/Alertness: Awake/alert Behavior During Therapy: WFL for tasks assessed/performed Overall Cognitive Status: Within Functional Limits for tasks assessed                                  General Comments: very sharp 86 yo   General Comments       Exercises     Shoulder Instructions      Home Living Family/patient expects to be discharged to:: Private residence Living Arrangements: Alone Available Help at Discharge: Family;Available PRN/intermittently Type of Home: Apartment Home Access: Level entry     Home Layout: One level     Bathroom Shower/Tub: Walk-in shower         Home Equipment: Kasandra Knudsen - single point   Additional Comments: daughters live nearby and check on him frequently. moving to ILF this month      Prior Functioning/Environment Level of Independence: Independent        Comments: drives, cooks, cleans. Used to golf but admits to more sedentary lifestyle in recent yrs. Widower x10 yrs.         OT Problem List: Decreased activity tolerance;Impaired balance (sitting and/or standing);Impaired vision/perception;Decreased knowledge of use of DME or AE;Decreased knowledge of precautions      OT Treatment/Interventions: Self-care/ADL training;Therapeutic exercise;DME and/or AE instruction;Visual/perceptual remediation/compensation;Patient/family education;Therapeutic activities    OT Goals(Current goals can be found in the care plan section) Acute Rehab OT Goals Patient Stated Goal: return home OT Goal Formulation: With patient Time For Goal Achievement: 07/30/19 Potential to Achieve Goals: Good  OT Frequency: Min 2X/week   Barriers to D/C:            Co-evaluation              AM-PAC OT "6 Clicks" Daily Activity     Outcome Measure Help from another person eating meals?: None Help from another person taking care of personal grooming?: None Help from another person toileting, which includes using toliet, bedpan, or urinal?: None Help from another person bathing (including washing, rinsing, drying)?: None Help from another person to put on and taking off regular upper body clothing?: None Help from another person to  put on and taking off regular lower body clothing?: None 6 Click Score: 24   End of Session    Activity Tolerance: Patient tolerated treatment well Patient left: in bed;with call bell/phone within reach;with bed alarm set  OT Visit Diagnosis: Unsteadiness on feet (R26.81);Low vision, both eyes (H54.2)                Time: 5465-6812 OT Time Calculation (min): 18 min Charges:  OT General Charges $OT Visit: 1 Visit OT Evaluation $OT Eval Low Complexity: [place], OT Acute Rehabilitation Services Pager: [phone] Office: [phone]  Hortencia Pilar 07/16/2019, 1:38 PM

## 2019-07-16 NOTE — Evaluation (Addendum)
Physical Therapy Evaluation Patient Details Name: Joseph Arellano MRN: [id number] DOB: [birth year] Today's Date: 07/16/2019   History of Present Illness   Joseph Arellano is a 86 y.o. male with a history of diabetes, B12 deficiency, hypercholesterolemia, hypertension who presents with visual changes. MRI positive for left occipital stroke.  Clinical Impression  Prior to admission, pt lives alone and is independent with mobility/ADL's; has two daughters who are available to assist. On PT evaluation, pt displays right homonymous hemianopsia. Ambulating hallway distances without physical assist. Demonstrates difficulty with locating signage in hallway on right side. Based on deficits, recommended no driving. Education provided on compensatory technique of head turns. Will continue to follow acutely to address but don't anticipate need for PT follow up.     Follow Up Recommendations No PT follow up;Supervision - Intermittent    Equipment Recommendations  None recommended by PT    Recommendations for Other Services       Precautions / Restrictions Precautions Precautions: Other (comment) Precaution Comments: R homonymous hemianopsia Restrictions Weight Bearing Restrictions: No      Mobility  Bed Mobility Overal bed mobility: Independent                Transfers Overall transfer level: Independent Equipment used: None                Ambulation/Gait Ambulation/Gait assistance: Modified independent (Device/Increase time) Gait Distance (Feet): 400 Feet Assistive device: None Gait Pattern/deviations: Decreased stride length     General Gait Details: No gross unsteadiness  Stairs            Wheelchair Mobility    Modified Rankin (Stroke Patients Only) Modified Rankin (Stroke Patients Only) Pre-Morbid Rankin Score: No symptoms Modified Rankin: Slight disability     Balance Overall balance assessment: Mild deficits observed, not formally tested                                            Pertinent Vitals/Pain Pain Assessment: No/denies pain    Home Living Family/patient expects to be discharged to:: Private residence Living Arrangements: Alone Available Help at Discharge: Family;Available PRN/intermittently Type of Home: Apartment Home Access: Level entry     Home Layout: One level Home Equipment: Cane - single point Additional Comments: daughters live nearby and check on him frequently    Prior Function Level of Independence: Independent         Comments: drives, cooks, cleans. Used to golf but admits to more sedentary lifestyle in recent yrs. Widower x10 yrs.      Hand Dominance   Dominant Hand: Right    Extremity/Trunk Assessment   Upper Extremity Assessment Upper Extremity Assessment: Overall WFL for tasks assessed    Lower Extremity Assessment Lower Extremity Assessment: Overall WFL for tasks assessed    Cervical / Trunk Assessment Cervical / Trunk Assessment: Normal  Communication   Communication: No difficulties  Cognition Arousal/Alertness: Awake/alert Behavior During Therapy: WFL for tasks assessed/performed Overall Cognitive Status: Within Functional Limits for tasks assessed                                        General Comments      Exercises     Assessment/Plan    PT Assessment Patient needs continued PT services  PT Problem  List Decreased balance       PT Treatment Interventions Gait training;Stair training;Functional mobility training;Therapeutic activities;Therapeutic exercise;Balance training;Patient/family education    PT Goals (Current goals can be found in the Care Plan section)  Acute Rehab PT Goals Patient Stated Goal: return home PT Goal Formulation: With patient Time For Goal Achievement: 07/30/19 Potential to Achieve Goals: Good    Frequency Min 3X/week   Barriers to discharge        Co-evaluation               AM-PAC PT "6  Clicks" Mobility  Outcome Measure Help needed turning from your back to your side while in a flat bed without using bedrails?: None Help needed moving from lying on your back to sitting on the side of a flat bed without using bedrails?: None Help needed moving to and from a bed to a chair (including a wheelchair)?: None Help needed standing up from a chair using your arms (e.g., wheelchair or bedside chair)?: None Help needed to walk in hospital room?: None Help needed climbing 3-5 steps with a railing? : A Little 6 Click Score: 23    End of Session   Activity Tolerance: Patient tolerated treatment well Patient left: in chair;with call bell/phone within reach;with chair alarm set Nurse Communication: Mobility status PT Visit Diagnosis: Unsteadiness on feet (R26.81)    Time: 5038-8828 PT Time Calculation (min) (ACUTE ONLY): 25 min   Charges:   PT Evaluation $PT Eval Moderate Complexity: 1 Mod PT Treatments $Therapeutic Activity: 8-22 mins          Wyona Almas, PT, DPT Acute Rehabilitation Services Pager [phone] Office [phone]   Deno Etienne 07/16/2019, 10:28 AM

## 2019-07-16 NOTE — Evaluation (Signed)
Speech Language Pathology Evaluation Patient Details Name: Joseph Arellano MRN: [id number] DOB: 12-07-1929 Today's Date: 07/16/2019 Time: 1010-1033 SLP Time Calculation (min) (ACUTE ONLY): 23 min  Problem List:  Patient Active Problem List   Diagnosis Date Noted  . Acute CVA (cerebrovascular accident) ([place]) 07/15/2019  . Hyperlipidemia 06/24/2019  . Diabetes mellitus type II, controlled ([place]) 06/24/2019  . B12 deficiency 06/24/2019  . GERD (gastroesophageal reflux disease) 06/24/2019  . Depression 06/24/2019  . Stroke ([place]) 06/23/2019  . Diabetic peripheral neuropathy ([place]) 01/09/2014   Past Medical History:  Past Medical History:  Diagnosis Date  . B12 deficiency   . Depression   . Diabetes ([place])   . GERD (gastroesophageal reflux disease)   . Hypercholesteremia   . Hypertension   . Peripheral neuropathy   . Pre-diabetes   . Stroke [facility])    Past Surgical History:  Past Surgical History:  Procedure Laterality Date  . CATARACT EXTRACTION Bilateral    HPI:  Pt is an 86 y.o. male with history of recent admission for TIA status post TPA discharged on 06/24/19 with PMH of diabetes mellitus, hypertension B12 deficiency  who was admitted secondary to blurry vision in the right eye for three days. MRI of the brain revealed acute/subacute nonhemorrhagic infarcts of the left occipital lobe.   Assessment / Plan / Recommendation Clinical Impression  Pt participated in speech/language evaluation with his daughter present. Both parties denied any acute changes in speech, language or cognition but the pt stated that he has been having some difficulty with memory since a prior CVA. Per the pt he compensates for this by writing important dates on a calendar and asking Alexa to remind him of tasks. His speech and language skills are currently within normal limits. His cognitive-linguistic skills were within functional limits. He exhibited some difficulty with memory and higher-level executive  functioning but both parties indicated that this is his baseline. Further skilled SLP services are not clinically indicated at this time. Pt, nursing, and his daughter were educated regarding results and recommendations; all parties verbalized understanding as well as agreement with plan of care.    SLP Assessment  SLP Recommendation/Assessment: Patient does not need any further Speech Lanaguage Pathology Services SLP Visit Diagnosis: Cognitive communication deficit (R41.841)    Follow Up Recommendations  None    Frequency and Duration           SLP Evaluation Cognition  Overall Cognitive Status: Within Functional Limits for tasks assessed Arousal/Alertness: Awake/alert Orientation Level: Oriented X4 Attention: Focused;Sustained Focused Attention: Appears intact Sustained Attention: Appears intact Memory: Impaired Memory Impairment: Storage deficit;Retrieval deficit(Immediate: 3/3; delayed: 0/3; with cues: 3/3) Awareness: Appears intact Problem Solving: Appears intact Executive Function: Reasoning Reasoning: Appears intact       Comprehension  Auditory Comprehension Overall Auditory Comprehension: Appears within functional limits for tasks assessed Yes/No Questions: Within Functional Limits Basic Immediate Environment Questions: (5/5) Complex Questions: (5/5) Paragraph Comprehension (via yes/no questions): (2/4) Commands: Within Functional Limits Two Step Basic Commands: (3/3) Multistep Basic Commands: (3/3) Conversation: Complex Visual Recognition/Discrimination Discrimination: Within Function Limits Reading Comprehension Reading Status: Within funtional limits    Expression Expression Primary Mode of Expression: Verbal Verbal Expression Overall Verbal Expression: Appears within functional limits for tasks assessed Initiation: No impairment Automatic Speech: Counting;Day of week;Month of year(WNL) Level of Generative/Spontaneous Verbalization:  Conversation Repetition: No impairment(5/5) Naming: No impairment Responsive: (5/5) Confrontation: (10/10) Convergent: (5/5) Pragmatics: No impairment Written Expression Dominant Hand: Right   Oral / Motor  Oral Motor/Sensory Function  Overall Oral Motor/Sensory Function: Within functional limits Motor Speech Overall Motor Speech: Appears within functional limits for tasks assessed Respiration: Within functional limits Phonation: Normal Resonance: Within functional limits Articulation: Within functional limitis Intelligibility: Intelligible Motor Planning: Witnin functional limits Motor Speech Errors: Not applicable   Ellias Mcelreath I. Vear Clock, MS, CCC-SLP Acute Rehabilitation Services Office number [phone] Pager [phone]            Scheryl Marten 07/16/2019, 10:48 AM

## 2019-07-16 NOTE — Progress Notes (Signed)
SLP Cancellation Note  Patient Details Name: Joseph Arellano MRN: [id number] DOB: [birth year]   Cancelled treatment:       Reason Eval/Treat Not Completed: Patient at procedure or test/unavailable(Pt with physical therapy at this time. SLP will follow up. )  Autumne Kallio I. Vear Clock, MS, CCC-SLP Acute Rehabilitation Services Office number [phone] Pager [phone]  Scheryl Marten 07/16/2019, 9:36 AM

## 2019-07-17 DIAGNOSIS — E1159 Type 2 diabetes mellitus with other circulatory complications: Secondary | ICD-10-CM

## 2019-07-17 LAB — GLUCOSE, CAPILLARY: Glucose-Capillary: 88 mg/dL (ref 70–99)

## 2019-07-17 LAB — BASIC METABOLIC PANEL
Anion gap: 10 (ref 5–15)
BUN: 16 mg/dL (ref 8–23)
CO2: 25 mmol/L (ref 22–32)
Calcium: 8.4 mg/dL — ABNORMAL LOW (ref 8.9–10.3)
Chloride: 106 mmol/L (ref 98–111)
Creatinine, Ser: 0.99 mg/dL (ref 0.61–1.24)
GFR calc Af Amer: >60 mL/min (ref >60)
GFR calc non Af Amer: >60 mL/min (ref >60)
Glucose, Bld: 97 mg/dL (ref 70–99)
Potassium: 3.5 mmol/L (ref 3.5–5.1)
Sodium: 141 mmol/L (ref 135–145)

## 2019-07-17 LAB — CBC
HCT: 33.7 % — ABNORMAL LOW (ref 39.0–52.0)
Hemoglobin: 11.2 g/dL — ABNORMAL LOW (ref 13.0–17.0)
MCH: 29.7 pg (ref 26.0–34.0)
MCHC: 33.2 g/dL (ref 30.0–36.0)
MCV: 89.4 fL (ref 80.0–100.0)
Platelets: 244 10*3/uL (ref 150–400)
RBC: 3.77 MIL/uL — ABNORMAL LOW (ref 4.22–5.81)
RDW: 13.1 % (ref 11.5–15.5)
WBC: 8.1 10*3/uL (ref 4.0–10.5)
nRBC: 0 % (ref 0.0–0.2)

## 2019-07-17 MED ORDER — CLOPIDOGREL BISULFATE 75 MG PO TABS: 75.0000 mg | ORAL_TABLET | Freq: Every day | ORAL | 0 refills | Status: DC

## 2019-07-17 MED FILL — CLOPIDOGREL 75 MG TABLET: 75 | 30 days supply | Qty: 30 | Fill #0

## 2019-07-17 NOTE — Progress Notes (Signed)
Patient discharged home in via private vehicle, daughter is accompanying him. All instructions have been reviewed and the patient has no further questions noted.

## 2019-07-17 NOTE — Discharge Summary (Signed)
Physician Discharge Summary  RANE BLITCH VQQ:[id number] DOB: 1930/03/01 DOA: 07/15/2019  PCP: Reynold Bowen, MD  Admit date: 07/15/2019 Discharge date: 07/17/2019  Admitted From: Home Disposition: Home  Recommendations for Outpatient Follow-up:  1. Follow up with PCP in 1-2 weeks 2. Follow-up with neurology in 6 weeks 3. Please obtain BMP/CBC in one week 4. Please follow up on the following pending results:  Home Health: None Equipment/Devices: None  Discharge Condition: Stable CODE STATUS: DNR Diet recommendation: Cardiac  Subjective: Seen and examined.  No complaints other than persistent vision loss in the right side.  Brief/Interim Summary: Bennie Chirico Ozmentis a 86 y.o.malewithhistory of recent admission for TIA status post TPA discharged on June 24, 2019 3 weeks ago with history of diabetes mellitus, hypertension B12 deficiency started experiencing blurry vision in the right eye over the last 3 days before presenting to ED. Patient was brought to the ER.  He did not have any other neurological complaint. In the ER CT head shows infarct involving the left occipital area. Neurologist on-call was consulted. Patient passed swallow. Patient on exam was able to move all extremities without difficulty. EKG shows normal sinus rhythm. Covid test was negative. Complete metabolic panel and CBC largely unremarkable.  CT angio of head and neck showed Stable CTA of the head and neck as compared to 06/23/2019. No large vessel occlusion or new vascular finding.. Intracranial atherosclerosis with associated severe proximal left PCA and left A2 stenoses, stable. Atherosclerotic change about the carotid bifurcations without high-grade stenosis. Patent vertebral arteries within the neck with mild multifocal irregularity and stenoses involving the proximal right vertebral artery.  MRI of the brain confirmed acute/subacute nonhemorrhagic infarct of the left occipital lobe and additional smaller  cortical nonhemorrhagic infarcts which are also acute/subacute in the more superior and medial left occipital lobe.  Patient was started on aspirin and Plavix.  As well as a statin.  He did not recommend any further work-up and recommended to continue aspirin and Plavix for 3 months and then only aspirin.  He was seen by PT OT and they did not recommend any further PT at home.  Neurology suspected possible sleep apnea and recommended smart sleep study/test overnight.  Patient was kept in the hospital on 07/16/2019 for that however late in the evening, patient decided not to do the test.  Patient was cleared by neurology to go home however due to his right hemianopia, he has been instructed not to drive.  This has been relayed to the patient and his family very clearly.  He is being discharged on aspirin, Plavix and statin.  Follow with PCP and neurology.   Discharge Diagnoses:  Principal Problem:   Acute CVA (cerebrovascular accident) [facility]) Active Problems:   Hyperlipidemia   Diabetes mellitus type II, controlled ([place])   Acute ischemic stroke [facility])    Discharge Instructions  Discharge Instructions    Discharge patient   Complete by: As directed    Discharge disposition: 01-Home or Self Care   Discharge patient date: 07/17/2019     Allergies as of 07/17/2019   No Known Allergies     Medication List    TAKE these medications   Alogliptin Benzoate 25 MG Tabs Take 25 mg by mouth daily.   aspirin 81 MG EC tablet Take 1 tablet (81 mg total) by mouth daily.   atorvastatin 20 MG tablet Commonly known as: LIPITOR Take 1 tablet (20 mg total) by mouth daily at 6 PM.   clopidogrel 75 MG tablet  Commonly known as: PLAVIX Take 1 tablet (75 mg total) by mouth daily. Start taking on: July 18, 2019 What changed: additional instructions   FIBER PO Take 1 capsule by mouth daily.   FISH OIL PO Take 1 capsule by mouth daily.   Lantus SoloStar 100 UNIT/ML Solostar Pen Generic drug: insulin  glargine Inject 36 Units into the skin daily after breakfast.   losartan 25 MG tablet Commonly known as: COZAAR Take 25 mg by mouth in the morning and at bedtime.   magnesium oxide 400 MG tablet Commonly known as: MAG-OX Take 400 mg by mouth daily as needed (for constipation).   NovoLOG FlexPen 100 UNIT/ML FlexPen Generic drug: insulin aspart Inject 16 Units into the skin See admin instructions. Inject 16 units into the skin after breakfast and 16 units after supper   omeprazole 20 MG capsule Commonly known as: PRILOSEC Take 20 mg by mouth daily before breakfast.   triamcinolone cream 0.1 % Commonly known as: KENALOG Apply 1 application topically See admin instructions. Apply 1 application to affected areas daily as needed for itching   vitamin B-12 1000 MCG tablet Commonly known as: CYANOCOBALAMIN Take 1,000 mcg by mouth daily.      Follow-up Information    Adrian Prince, MD Follow up in 1 week(s).   Specialty: Endocrinology Contact information: [address] [place] [address] [phone]        Micki Riley, MD Follow up in 6 week(s).   Specialties: Neurology, Radiology Contact information: [address] [place] [address] [phone]          No Known Allergies  Consultations: Neurology   Procedures/Studies: CT ANGIO HEAD W OR WO CONTRAST  Result Date: 07/15/2019 CLINICAL DATA:  Initial evaluation for visual changes over past 2 days. EXAM: CT ANGIOGRAPHY HEAD AND NECK TECHNIQUE: Multidetector CT imaging of the head and neck was performed using the standard protocol during bolus administration of intravenous contrast. Multiplanar CT image reconstructions and MIPs were obtained to evaluate the vascular anatomy. Carotid stenosis measurements (when applicable) are obtained utilizing NASCET criteria, using the distal internal carotid diameter as the denominator. CONTRAST:  OMNIPAQUE IOHEXOL 350 MG/ML SOLN COMPARISON:  Recent  CTA from 06/23/2019. FINDINGS: CTA NECK FINDINGS Aortic arch: Visualized aortic arch of normal caliber with normal 3 vessel morphology. Mild atheromatous change about the origin of the great vessels without hemodynamically significant stenosis. Visualized subclavian arteries widely patent. Right carotid system: Right CCA patent from its origin to the bifurcation without hemodynamically significant stenosis. Soft plaque about the right carotid bifurcation with relatively mild 25-30% stenosis by NASCET criteria. Right ICA patent distally to the skull base without stenosis, dissection, or occlusion. Left carotid system: Left CCA patent from its origin to the bifurcation without stenosis. Luminal irregularity with soft plaque about the left bifurcation and proximal left ICA without significant stenosis. Left ICA widely patent distally to the skull base without dissection or occlusion. Vertebral arteries: Both vertebral arteries arise from the subclavian arteries. Left vertebral artery dominant. Multifocal atheromatous irregularity about the right V1 and V2 segments without high-grade stenosis. Vertebral arteries otherwise widely patent within the neck. Skeleton: No acute osseous abnormality. No discrete lytic or blastic osseous lesions. Patient is edentulous. Mild multilevel cervical spondylosis without significant stenosis. Other neck: 8 mm hypodense left thyroid nodule, for which no follow-up imaging is recommended. No other mass lesion or adenopathy. Few scattered calcified tonsilliths noted within the left palatine tonsil. Upper chest: Visualized upper chest demonstrates no  acute finding. Review of the MIP images confirms the above findings CTA HEAD FINDINGS Anterior circulation: Petrous segments patent bilaterally. Mild atheromatous change within the cavernous/supraclinoid ICAs without significant stenosis. A1 segments patent bilaterally. Normal anterior communicating artery. Short-segment severe left A2 stenosis  noted, stable. Right ACA widely patent to its distal aspect without abnormality. Irregular mild multifocal narrowing of the distal left M1 segment. Normal left MCA bifurcation. Diffuse small vessel atheromatous irregularity. Right M1 widely patent. Normal right MCA bifurcation. Moderate to advanced proximal right M2 stenoses noted. Diffuse small vessel atheromatous irregularity. Posterior circulation: Mild atheromatous irregularity within the right V4 segment without significant stenosis. Neither PICA of visualized. Focal non stenotic plaque noted at the vertebrobasilar junction. Basilar widely patent distally to its distal aspect without flow-limiting stenosis. Superior cerebral arteries patent bilaterally. Both PCAs primarily supplied via the basilar. Short-segment severe left P1/P2 stenosis again noted, stable. PCAs otherwise widely patent to their distal aspects. Posterior communicating arteries not well seen, and may be hypoplastic and/or absent. Venous sinuses: Patent. Anatomic variants: None significant.  No intracranial aneurysm. Review of the MIP images confirms the above findings IMPRESSION: 1. Stable CTA of the head and neck as compared to 06/23/2019. No large vessel occlusion or new vascular finding. 2. Intracranial atherosclerosis with associated severe proximal left PCA and left A2 stenoses, stable. 3. Atherosclerotic change about the carotid bifurcations without high-grade stenosis. 4. Patent vertebral arteries within the neck with mild multifocal irregularity and stenoses involving the proximal right vertebral artery. Electronically Signed   By: Rise MuBenjamin  McClintock M.D.   On: 07/15/2019 22:54   CT ANGIO HEAD W OR WO CONTRAST  Result Date: 06/23/2019 CLINICAL DATA:  Right-sided weakness and slurred speech. EXAM: CT ANGIOGRAPHY HEAD AND NECK TECHNIQUE: Multidetector CT imaging of the head and neck was performed using the standard protocol during bolus administration of intravenous contrast.  Multiplanar CT image reconstructions and MIPs were obtained to evaluate the vascular anatomy. Carotid stenosis measurements (when applicable) are obtained utilizing NASCET criteria, using the distal internal carotid diameter as the denominator. CONTRAST:  75mL OMNIPAQUE IOHEXOL 350 MG/ML SOLN COMPARISON:  None. FINDINGS: CTA NECK FINDINGS Aortic arch: Standard 3 vessel aortic arch with mild atherosclerotic plaque. Widely patent arch vessel origins. Right carotid system: Patent with soft plaque in the carotid bulb resulting in luminal irregularity but only 25% proximal ICA stenosis. Left carotid system: Patent with scattered soft plaque in the common and internal carotid arteries resulting in luminal irregularity but no significant stenosis. Vertebral arteries: Patent with the left being moderately dominant. Mild multifocal narrowing of the V1 and proximal V2 segments on the right. Skeleton: Mild cervical disc degeneration. Severe right-sided facet arthrosis at C3-4 and C4-5 with facet ankylosis at the latter. Other neck: 8 mm hypoattenuating nodule in the left thyroid lobe for which no further imaging evaluation is recommended. Upper chest: No apical lung consolidation or mass. Review of the MIP images confirms the above findings CTA HEAD FINDINGS Anterior circulation: The internal carotid arteries are patent from skull base to carotid termini with mild atherosclerotic plaque bilaterally not resulting in significant stenosis. ACAs and MCAs are patent without evidence of proximal branch occlusion. There is a mild distal left M1 stenosis. There is MCA branch vessel irregularity bilaterally including a moderate to severe right M2 superior division branch vessel stenosis. There is also a severe left A2 stenosis. No aneurysm is identified. Posterior circulation: The intracranial vertebral arteries are patent to the basilar with mild irregularity bilaterally but no significant stenosis.  Patent AICA and SCA origins are  seen bilaterally. The basilar artery is patent with mild diffuse irregularity but no significant stenosis. Posterior communicating arteries are not identified and may be diminutive or absent. PCAs are patent with a severe stenosis near the left P1-P2 junction. No aneurysm is identified. Venous sinuses: Patent. Anatomic variants: None. Review of the MIP images confirms the above findings IMPRESSION: 1. No large vessel occlusion. 2. Intracranial atherosclerosis including severe proximal left PCA and left A2 stenoses. 3. Cervical carotid artery atherosclerosis without significant stenosis. 4. Patent vertebral arteries with mild proximal stenosis on the right. These results were communicated to Dr. Laurence Slate at 12:12 pm on 06/23/2019 by text page via the [facility] messaging system. Electronically Signed   By: Sebastian Ache M.D.   On: 06/23/2019 12:25   CT HEAD WO CONTRAST  Result Date: 07/15/2019 CLINICAL DATA:  Right visual field defect. Facial droop. EXAM: CT HEAD WITHOUT CONTRAST TECHNIQUE: Contiguous axial images were obtained from the base of the skull through the vertex without intravenous contrast. COMPARISON:  June 23, 2019. FINDINGS: Brain: Mild diffuse cortical atrophy is noted. Mild chronic ischemic white matter disease is noted. No mass effect or midline shift is noted. New wedge-shaped low density is noted involving the left occipital lobe concerning for acute infarction. No hemorrhage or mass lesion is noted. Vascular: No hyperdense vessel or unexpected calcification. Skull: Normal. Negative for fracture or focal lesion. Sinuses/Orbits: No acute finding. Other: None. IMPRESSION: New wedge-shaped low density is noted involving the left occipital lobe concerning for acute infarction. MRI is recommended for further evaluation. Electronically Signed   By: Lupita Raider M.D.   On: 07/15/2019 18:14   CT ANGIO NECK W OR WO CONTRAST  Result Date: 07/15/2019 CLINICAL DATA:  Initial evaluation for visual changes  over past 2 days. EXAM: CT ANGIOGRAPHY HEAD AND NECK TECHNIQUE: Multidetector CT imaging of the head and neck was performed using the standard protocol during bolus administration of intravenous contrast. Multiplanar CT image reconstructions and MIPs were obtained to evaluate the vascular anatomy. Carotid stenosis measurements (when applicable) are obtained utilizing NASCET criteria, using the distal internal carotid diameter as the denominator. CONTRAST:  OMNIPAQUE IOHEXOL 350 MG/ML SOLN COMPARISON:  Recent CTA from 06/23/2019. FINDINGS: CTA NECK FINDINGS Aortic arch: Visualized aortic arch of normal caliber with normal 3 vessel morphology. Mild atheromatous change about the origin of the great vessels without hemodynamically significant stenosis. Visualized subclavian arteries widely patent. Right carotid system: Right CCA patent from its origin to the bifurcation without hemodynamically significant stenosis. Soft plaque about the right carotid bifurcation with relatively mild 25-30% stenosis by NASCET criteria. Right ICA patent distally to the skull base without stenosis, dissection, or occlusion. Left carotid system: Left CCA patent from its origin to the bifurcation without stenosis. Luminal irregularity with soft plaque about the left bifurcation and proximal left ICA without significant stenosis. Left ICA widely patent distally to the skull base without dissection or occlusion. Vertebral arteries: Both vertebral arteries arise from the subclavian arteries. Left vertebral artery dominant. Multifocal atheromatous irregularity about the right V1 and V2 segments without high-grade stenosis. Vertebral arteries otherwise widely patent within the neck. Skeleton: No acute osseous abnormality. No discrete lytic or blastic osseous lesions. Patient is edentulous. Mild multilevel cervical spondylosis without significant stenosis. Other neck: 8 mm hypodense left thyroid nodule, for which no follow-up imaging is  recommended. No other mass lesion or adenopathy. Few scattered calcified tonsilliths noted within the left palatine tonsil. Upper chest:  Visualized upper chest demonstrates no acute finding. Review of the MIP images confirms the above findings CTA HEAD FINDINGS Anterior circulation: Petrous segments patent bilaterally. Mild atheromatous change within the cavernous/supraclinoid ICAs without significant stenosis. A1 segments patent bilaterally. Normal anterior communicating artery. Short-segment severe left A2 stenosis noted, stable. Right ACA widely patent to its distal aspect without abnormality. Irregular mild multifocal narrowing of the distal left M1 segment. Normal left MCA bifurcation. Diffuse small vessel atheromatous irregularity. Right M1 widely patent. Normal right MCA bifurcation. Moderate to advanced proximal right M2 stenoses noted. Diffuse small vessel atheromatous irregularity. Posterior circulation: Mild atheromatous irregularity within the right V4 segment without significant stenosis. Neither PICA of visualized. Focal non stenotic plaque noted at the vertebrobasilar junction. Basilar widely patent distally to its distal aspect without flow-limiting stenosis. Superior cerebral arteries patent bilaterally. Both PCAs primarily supplied via the basilar. Short-segment severe left P1/P2 stenosis again noted, stable. PCAs otherwise widely patent to their distal aspects. Posterior communicating arteries not well seen, and may be hypoplastic and/or absent. Venous sinuses: Patent. Anatomic variants: None significant.  No intracranial aneurysm. Review of the MIP images confirms the above findings IMPRESSION: 1. Stable CTA of the head and neck as compared to 06/23/2019. No large vessel occlusion or new vascular finding. 2. Intracranial atherosclerosis with associated severe proximal left PCA and left A2 stenoses, stable. 3. Atherosclerotic change about the carotid bifurcations without high-grade stenosis. 4.  Patent vertebral arteries within the neck with mild multifocal irregularity and stenoses involving the proximal right vertebral artery. Electronically Signed   By: Rise MuBenjamin  McClintock M.D.   On: 07/15/2019 22:54   CT ANGIO NECK W OR WO CONTRAST  Result Date: 06/23/2019 CLINICAL DATA:  Right-sided weakness and slurred speech. EXAM: CT ANGIOGRAPHY HEAD AND NECK TECHNIQUE: Multidetector CT imaging of the head and neck was performed using the standard protocol during bolus administration of intravenous contrast. Multiplanar CT image reconstructions and MIPs were obtained to evaluate the vascular anatomy. Carotid stenosis measurements (when applicable) are obtained utilizing NASCET criteria, using the distal internal carotid diameter as the denominator. CONTRAST:  75mL OMNIPAQUE IOHEXOL 350 MG/ML SOLN COMPARISON:  None. FINDINGS: CTA NECK FINDINGS Aortic arch: Standard 3 vessel aortic arch with mild atherosclerotic plaque. Widely patent arch vessel origins. Right carotid system: Patent with soft plaque in the carotid bulb resulting in luminal irregularity but only 25% proximal ICA stenosis. Left carotid system: Patent with scattered soft plaque in the common and internal carotid arteries resulting in luminal irregularity but no significant stenosis. Vertebral arteries: Patent with the left being moderately dominant. Mild multifocal narrowing of the V1 and proximal V2 segments on the right. Skeleton: Mild cervical disc degeneration. Severe right-sided facet arthrosis at C3-4 and C4-5 with facet ankylosis at the latter. Other neck: 8 mm hypoattenuating nodule in the left thyroid lobe for which no further imaging evaluation is recommended. Upper chest: No apical lung consolidation or mass. Review of the MIP images confirms the above findings CTA HEAD FINDINGS Anterior circulation: The internal carotid arteries are patent from skull base to carotid termini with mild atherosclerotic plaque bilaterally not resulting in  significant stenosis. ACAs and MCAs are patent without evidence of proximal branch occlusion. There is a mild distal left M1 stenosis. There is MCA branch vessel irregularity bilaterally including a moderate to severe right M2 superior division branch vessel stenosis. There is also a severe left A2 stenosis. No aneurysm is identified. Posterior circulation: The intracranial vertebral arteries are patent to the basilar with mild irregularity  bilaterally but no significant stenosis. Patent AICA and SCA origins are seen bilaterally. The basilar artery is patent with mild diffuse irregularity but no significant stenosis. Posterior communicating arteries are not identified and may be diminutive or absent. PCAs are patent with a severe stenosis near the left P1-P2 junction. No aneurysm is identified. Venous sinuses: Patent. Anatomic variants: None. Review of the MIP images confirms the above findings IMPRESSION: 1. No large vessel occlusion. 2. Intracranial atherosclerosis including severe proximal left PCA and left A2 stenoses. 3. Cervical carotid artery atherosclerosis without significant stenosis. 4. Patent vertebral arteries with mild proximal stenosis on the right. These results were communicated to Dr. Laurence Slate at 12:12 pm on 06/23/2019 by text page via the [facility] messaging system. Electronically Signed   By: Sebastian Ache M.D.   On: 06/23/2019 12:25   MR BRAIN WO CONTRAST  Result Date: 07/16/2019 CLINICAL DATA:  Blurred vision in the right eye. EXAM: MRI HEAD WITHOUT CONTRAST TECHNIQUE: Multiplanar, multiecho pulse sequences of the brain and surrounding structures were obtained without intravenous contrast. COMPARISON:  MR head without contrast 06/24/2019. CT head and CTA head 07/15/2019. FINDINGS: Brain: Diffusion weighted images confirm an acute infarct in the inferior left occipital lobe. Additional smaller cortical infarcts are present more superiorly in the medial left occipital lobe. No acute hemorrhage mass  lesion is present. T2 signal changes are associated with the area of acute/subacute infarction. Atrophy and white matter disease is otherwise stable. The ventricles are proportionate to the degree of atrophy. White matter changes extend into the brainstem. Remote lacunar infarct is present in the left cerebellum. No significant extraaxial fluid collection is present. Vascular: Flow is present in the major intracranial arteries. Skull and upper cervical spine: The craniocervical junction is normal. Upper cervical spine is within normal limits. Marrow signal is unremarkable. Sinuses/Orbits: The paranasal sinuses and mastoid air cells are clear. Bilateral lens replacements are noted. Globes and orbits are otherwise unremarkable. IMPRESSION: 1. Acute/subacute nonhemorrhagic infarcts of the left occipital lobe as seen by CT. 2. Additional smaller cortical nonhemorrhagic infarcts are acute/subacute in the more superior and medial left occipital lobe. 3. Advanced atrophy and white matter disease is otherwise stable likely reflecting the sequela of chronic microvascular ischemia. Electronically Signed   By: Marin Roberts M.D.   On: 07/16/2019 07:59   MR BRAIN WO CONTRAST  Result Date: 06/24/2019 CLINICAL DATA:  Dysarthria and right-sided weakness. EXAM: MRI HEAD WITHOUT CONTRAST TECHNIQUE: Multiplanar, multiecho pulse sequences of the brain and surrounding structures were obtained without intravenous contrast. COMPARISON:  Head CT June 23, 2019 FINDINGS: Brain: No acute infarction, hemorrhage, hydrocephalus, extra-axial collection or mass lesion. Old lacunar infarcts are noted in a periventricular white matter, more pronounced adjacent to the left frontal horn. Confluent T2 hyperintensity is seen in the periventricular white matter extending to the centrum semiovale bilaterally as well as within the pons, nonspecific, most likely related to chronic small vessel ischemia. Priors prominence of the supratentorial  ventricles and cerebral sulci, consistent with parenchymal volume loss. Vascular: Normal flow voids. Skull and upper cervical spine: Normal marrow signal. Sinuses/Orbits: Small right mastoid effusion. Bilateral lens surgery. Other: None. IMPRESSION: 1. No acute intracranial abnormality. 2. Old lacunar infarcts in the periventricular white matter, more pronounced adjacent to the left frontal horn. 3. Moderate chronic small vessel ischemia and parenchymal volume loss. Electronically Signed   By: Baldemar Lenis M.D.   On: 06/24/2019 13:24   ECHOCARDIOGRAM COMPLETE  Result Date: 06/24/2019    ECHOCARDIOGRAM REPORT  Patient Name:   SHREYAN HINZ Date of Exam: 06/24/2019 Medical Rec #:  [id number]      Height:       70.0 in Accession #:    [id number]     Weight:       191.4 lb Date of Birth:  1929-07-07      BSA:          2.05 m Patient Age:    86 years       BP:           168/62 mmHg Patient Gender: M              HR:           64 bpm. Exam Location:  Inpatient Procedure: 2D Echo Indications:    Stroke 434.91 / I163.9  History:        Patient has no prior history of Echocardiogram examinations.                 Risk Factors:Dyslipidemia and Diabetes.  Sonographer:    Leeroy Bock Turrentine Referring Phys: [id number] SUSHANTH R AROOR IMPRESSIONS  1. Left ventricular ejection fraction, by estimation, is 60 to 65%. The left ventricle has normal function. The left ventrical has no regional wall motion abnormalities. There is mildly increased left ventricular hypertrophy. Left ventricular diastolic parameters are consistent with Grade I diastolic dysfunction (impaired relaxation).  2. Right ventricular systolic function is normal. The right ventricular size is normal. Tricuspid regurgitation signal is inadequate for assessing PA pressure.  3. Trivial mitral valve regurgitation.  4. The aortic valve is tricuspid. Aortic valve regurgitation is not visualized. Mioderate aortic valve sclerosis/calcification is present,  without any evidence of aortic stenosis.  5. The inferior vena cava is normal in size with greater than 50% respiratory variability, suggesting right atrial pressure of 3 mmHg. FINDINGS  Left Ventricle: Left ventricular ejection fraction, by estimation, is 60 to 65%. The left ventricle has normal function. The left ventricle has no regional wall motion abnormalities. The left ventricular internal cavity size was normal in size. There is  mildly increased left ventricular hypertrophy. Concentric left ventricular hypertrophy. Left ventricular diastolic parameters are consistent with Grade I diastolic dysfunction (impaired relaxation). Right Ventricle: The right ventricular size is normal. No increase in right ventricular wall thickness. Right ventricular systolic function is normal. Tricuspid regurgitation signal is inadequate for assessing PA pressure. Left Atrium: Left atrial size was normal in size. Right Atrium: Right atrial size was normal in size. Pericardium: There is no evidence of pericardial effusion. Mitral Valve: The mitral valve is normal in structure and function. Normal mobility of the mitral valve leaflets. Trivial mitral valve regurgitation. No evidence of mitral valve stenosis. Tricuspid Valve: The tricuspid valve is normal in structure. Tricuspid valve regurgitation is not demonstrated. No evidence of tricuspid stenosis. Aortic Valve: The aortic valve is tricuspid. Aortic valve regurgitation is not visualized. Mild to moderate aortic valve sclerosis/calcification is present, without any evidence of aortic stenosis. Pulmonic Valve: The pulmonic valve was normal in structure. Pulmonic valve regurgitation is not visualized. No evidence of pulmonic stenosis. Aorta: The aortic root is normal in size and structure. Venous: The inferior vena cava is normal in size with greater than 50% respiratory variability, suggesting right atrial pressure of 3 mmHg. The inferior vena cava and the hepatic vein show a  normal flow pattern. IAS/Shunts: No atrial level shunt detected by color flow Doppler.  LEFT VENTRICLE PLAX 2D LVIDd:  3.47 cm  Diastology LVIDs:         2.53 cm  LV e' lateral:   6.20 cm/s LV PW:         1.23 cm  LV E/e' lateral: 9.8 LV IVS:        1.24 cm  LV e' medial:    7.72 cm/s LVOT diam:     1.80 cm  LV E/e' medial:  7.9 LV SV:         50.89 ml LV SV Index:   12.86 LVOT Area:     2.54 cm  RIGHT VENTRICLE RV S prime:     16.30 cm/s TAPSE (M-mode): 1.6 cm LEFT ATRIUM           Index       RIGHT ATRIUM           Index LA diam:      3.30 cm 1.61 cm/m  RA Area:     16.50 cm LA Vol (A4C): 59.7 ml 29.14 ml/m RA Volume:   41.40 ml  20.21 ml/m  AORTIC VALVE LVOT Vmax:   94.00 cm/s LVOT Vmean:  71.100 cm/s LVOT VTI:    0.200 m  AORTA Ao Root diam: 3.10 cm MITRAL VALVE MV Area (PHT): 3.23 cm             SHUNTS MV Decel Time: 235 msec             Systemic VTI:  0.20 m MV E velocity: 60.80 cm/s 103 cm/s  Systemic Diam: 1.80 cm MV A velocity: 88.70 cm/s 70.3 cm/s MV E/A ratio:  0.69       1.5 Armanda Magic MD Electronically signed by Armanda Magic MD Signature Date/Time: 06/24/2019/12:12:49 PM    Final    CT HEAD CODE STROKE WO CONTRAST  Result Date: 06/23/2019 CLINICAL DATA:  Code stroke. 86 year old male last seen normal at 0 900 hours. Right side weakness and slurred speech. EXAM: CT HEAD WITHOUT CONTRAST TECHNIQUE: Contiguous axial images were obtained from the base of the skull through the vertex without intravenous contrast. COMPARISON:  None. FINDINGS: Brain: No acute intracranial hemorrhage identified. No midline shift, mass effect, or evidence of intracranial mass lesion. No ventriculomegaly. Patchy and confluent bilateral cerebral white matter hypodensity. No cortically based acute infarct identified. The deep gray nuclei and posterior fossa remain within normal limits. Vascular: Calcified atherosclerosis at the skull base. No suspicious intracranial vascular hyperdensity. Skull: No acute osseous  abnormality identified. Sinuses/Orbits: Bubbly opacity in the left sphenoid sinus, other paranasal sinuses and mastoids are clear. Other: No acute orbit or scalp soft tissue finding. ASPECTS [facility] Stroke Program Early CT Score) Total score (0-10 with 10 being normal): 10 IMPRESSION: 1. No acute cortically based infarct or acute intracranial hemorrhage identified. ASPECTS 10. 2. Symmetric appearing cerebral white matter changes most commonly due to chronic small vessel disease. 3. These results were communicated to Dr. Laurence Slate at 11:48 am on 06/23/2019 by text page via the [facility] messaging system. Electronically Signed   By: Odessa Fleming M.D.   On: 06/23/2019 11:49      Discharge Exam: Vitals:   07/17/19 0357 07/17/19 0800  BP:  (!) 158/68  Pulse:  73  Resp: 15 18  Temp:  98 F (36.7 C)  SpO2: 98% 97%   Vitals:   07/17/19 0105 07/17/19 0300 07/17/19 0357 07/17/19 0800  BP: (!) 155/71   (!) 158/68  Pulse:    73  Resp:  17 15 18  Temp:    98 F (36.7 C)  TempSrc:    Oral  SpO2:  98% 98% 97%  Weight:      Height:        General: Pt is alert, awake, not in acute distress Cardiovascular: RRR, S1/S2 +, no rubs, no gallops Respiratory: CTA bilaterally, no wheezing, no rhonchi Abdominal: Soft, NT, ND, bowel sounds + Extremities: no edema, no cyanosis    The results of significant diagnostics from this hospitalization (including imaging, microbiology, ancillary and laboratory) are listed below for reference.     Microbiology: Recent Results (from the past 240 hour(s))  SARS CORONAVIRUS 2 (TAT 6-24 HRS) Nasopharyngeal Nasopharyngeal Swab     Status: None   Collection Time: 07/15/19  7:19 PM   Specimen: Nasopharyngeal Swab  Result Value Ref Range Status   SARS Coronavirus 2 NEGATIVE NEGATIVE Final    Comment: (NOTE) SARS-CoV-2 target nucleic acids are NOT DETECTED. The SARS-CoV-2 RNA is generally detectable in upper and lower respiratory specimens during the acute phase of infection.  Negative results do not preclude SARS-CoV-2 infection, do not rule out co-infections with other pathogens, and should not be used as the sole basis for treatment or other patient management decisions. Negative results must be combined with clinical observations, patient history, and epidemiological information. The expected result is Negative. Fact Sheet for Patients: HairSlick.no Fact Sheet for Healthcare Providers: quierodirigir.com This test is not yet approved or cleared by the [place] FDA and  has been authorized for detection and/or diagnosis of SARS-CoV-2 by FDA under an Emergency Use Authorization (EUA). This EUA will remain  in effect (meaning this test can be used) for the duration of the COVID-19 declaration under Section 56 4(b)(1) of the Act, 21 U.S.C. section 360bbb-3(b)(1), unless the authorization is terminated or revoked sooner. Performed at [facility], [address] [place], [place] [address]      Labs: BNP (last 3 results) No results for input(s): BNP in the last 8760 hours. Basic Metabolic Panel: Recent Labs  Lab 07/15/19 1714 07/15/19 1720 07/17/19 0223  NA 140 141 141  K 3.6 3.6 3.5  CL 104 102 106  CO2 27  --  25  GLUCOSE 116* 112* 97  BUN 22 24* 16  CREATININE 1.04 1.00 0.99  CALCIUM 9.1  --  8.4*   Liver Function Tests: Recent Labs  Lab 07/15/19 1714  AST 24  ALT 21  ALKPHOS 62  BILITOT 0.6  PROT 7.2  ALBUMIN 3.6   No results for input(s): LIPASE, AMYLASE in the last 168 hours. No results for input(s): AMMONIA in the last 168 hours. CBC: Recent Labs  Lab 07/15/19 1714 07/15/19 1720 07/17/19 0223  WBC 9.4  --  8.1  NEUTROABS 6.0  --   --   HGB 13.1 13.3 11.2*  HCT 40.4 39.0 33.7*  MCV 91.6  --  89.4  PLT 299  --  244   Cardiac Enzymes: No results for input(s): CKTOTAL, CKMB, CKMBINDEX, TROPONINI in the last 168 hours. BNP: Invalid input(s):  POCBNP CBG: Recent Labs  Lab 07/16/19 0855 07/16/19 1203 07/16/19 1625 07/16/19 2122 07/17/19 0626  GLUCAP 96 158* 179* 135* 88   D-Dimer No results for input(s): DDIMER in the last 72 hours. Hgb A1c No results for input(s): HGBA1C in the last 72 hours. Lipid Profile No results for input(s): CHOL, HDL, LDLCALC, TRIG, CHOLHDL, LDLDIRECT in the last 72 hours. Thyroid function studies No results for input(s): TSH, T4TOTAL, T3FREE, THYROIDAB  in the last 72 hours.  Invalid input(s): FREET3 Anemia work up No results for input(s): VITAMINB12, FOLATE, FERRITIN, TIBC, IRON, RETICCTPCT in the last 72 hours. Urinalysis No results found for: COLORURINE, APPEARANCEUR, LABSPEC, PHURINE, GLUCOSEU, HGBUR, BILIRUBINUR, KETONESUR, PROTEINUR, UROBILINOGEN, NITRITE, LEUKOCYTESUR Sepsis Labs Invalid input(s): PROCALCITONIN,  WBC,  LACTICIDVEN Microbiology Recent Results (from the past 240 hour(s))  SARS CORONAVIRUS 2 (TAT 6-24 HRS) Nasopharyngeal Nasopharyngeal Swab     Status: None   Collection Time: 07/15/19  7:19 PM   Specimen: Nasopharyngeal Swab  Result Value Ref Range Status   SARS Coronavirus 2 NEGATIVE NEGATIVE Final    Comment: (NOTE) SARS-CoV-2 target nucleic acids are NOT DETECTED. The SARS-CoV-2 RNA is generally detectable in upper and lower respiratory specimens during the acute phase of infection. Negative results do not preclude SARS-CoV-2 infection, do not rule out co-infections with other pathogens, and should not be used as the sole basis for treatment or other patient management decisions. Negative results must be combined with clinical observations, patient history, and epidemiological information. The expected result is Negative. Fact Sheet for Patients: HairSlick.no Fact Sheet for Healthcare Providers: quierodirigir.com This test is not yet approved or cleared by the [place] FDA and  has been authorized for  detection and/or diagnosis of SARS-CoV-2 by FDA under an Emergency Use Authorization (EUA). This EUA will remain  in effect (meaning this test can be used) for the duration of the COVID-19 declaration under Section 56 4(b)(1) of the Act, 21 U.S.C. section 360bbb-3(b)(1), unless the authorization is terminated or revoked sooner. Performed at [facility], [address], [place], [place] [address]      Time coordinating discharge: Over 30 minutes  SIGNED:   Hughie Closs, MD  [facility] 07/17/2019, 9:31 AM  If 7PM-7AM, please contact night-coverage [email or web address]

## 2019-07-17 NOTE — Discharge Instructions (Signed)
No driving until vision improves. Patient and family aware

## 2019-07-17 NOTE — Progress Notes (Signed)
Physical Therapy Treatment Patient Details Name: Joseph Arellano MRN: [id number] DOB: 05-13-1930 Today's Date: 07/17/2019    History of Present Illness  Joseph Arellano is a 86 y.o. male with a history of diabetes, B12 deficiency, hypercholesterolemia, hypertension who presents with visual changes. MRI positive for left occipital stroke.    PT Comments    Pt continues with main deficit of right homonymous hemianopsia and mild dynamic balance impairments. Pt ambulating hallway distances without an assistive device or physical assist. Able to negotiate 2 steps to practice unlevel surfaces. Still has difficulty locating signage on right side of hallway. Education reinforced regarding compensatory head turn technique, no driving, supervision for medication management, and use of cane for unlevel surfaces. Pt and pt daughter verbalized understanding.    Follow Up Recommendations  No PT follow up;Supervision - Intermittent     Equipment Recommendations  None recommended by PT    Recommendations for Other Services       Precautions / Restrictions Precautions Precautions: Other (comment) Precaution Comments: R homonymous hemianopsia Restrictions Weight Bearing Restrictions: No    Mobility  Bed Mobility Overal bed mobility: Independent                Transfers Overall transfer level: Independent Equipment used: None                Ambulation/Gait Ambulation/Gait assistance: Modified independent (Device/Increase time) Gait Distance (Feet): 400 Feet Assistive device: None Gait Pattern/deviations: Decreased stride length     General Gait Details: Decreased bilateral foot clearance, no gross unsteadiness   Stairs Stairs: Yes Stairs assistance: Modified independent (Device/Increase time) Stair Management: Two rails Number of Stairs: 2 General stair comments: Step over step ascending, step by step descending   Wheelchair Mobility    Modified Rankin (Stroke  Patients Only) Modified Rankin (Stroke Patients Only) Pre-Morbid Rankin Score: No symptoms Modified Rankin: Slight disability     Balance Overall balance assessment: Mild deficits observed, not formally tested                                          Cognition Arousal/Alertness: Awake/alert Behavior During Therapy: WFL for tasks assessed/performed Overall Cognitive Status: Within Functional Limits for tasks assessed                                        Exercises      General Comments        Pertinent Vitals/Pain Pain Assessment: No/denies pain    Home Living                      Prior Function            PT Goals (current goals can now be found in the care plan section) Acute Rehab PT Goals Patient Stated Goal: return home Potential to Achieve Goals: Good Progress towards PT goals: Progressing toward goals    Frequency    Min 3X/week      PT Plan Current plan remains appropriate    Co-evaluation              AM-PAC PT "6 Clicks" Mobility   Outcome Measure  Help needed turning from your back to your side while in a flat bed without using bedrails?: None Help needed moving from  lying on your back to sitting on the side of a flat bed without using bedrails?: None Help needed moving to and from a bed to a chair (including a wheelchair)?: None Help needed standing up from a chair using your arms (e.g., wheelchair or bedside chair)?: None Help needed to walk in hospital room?: None Help needed climbing 3-5 steps with a railing? : None 6 Click Score: 24    End of Session Equipment Utilized During Treatment: Gait belt Activity Tolerance: Patient tolerated treatment well Patient left: in chair;with call bell/phone within reach;with family/visitor present Nurse Communication: Mobility status PT Visit Diagnosis: Unsteadiness on feet (R26.81)     Time: 6712-4580 PT Time Calculation (min) (ACUTE ONLY): 19  min  Charges:  $Therapeutic Activity: 8-22 mins                       Lillia Pauls, PT, DPT Acute Rehabilitation Services Pager [phone] Office [phone]    Norval Morton 07/17/2019, 11:38 AM

## 2019-07-17 NOTE — Plan of Care (Signed)
Patient stable, discussed POC with patient and daughter, agreeable with plan, denies question/concerns at this time.  

## 2019-07-17 NOTE — TOC Transition Note (Signed)
Transition of Care [facility]) - CM/SW Discharge Note   Patient Details  Name: Joseph Arellano MRN: [id number] Date of Birth: 1929-05-24  [facility]) CM/SW Contact:  Kermit Balo, RN Phone Number: 07/17/2019, 10:15 AM   Clinical Narrative:    Pt discharging home with self care. OT recommending HH services but patient refused.  No DME needs.  Pt states daughters can check on him as needed.  Daughter to provide transport home.   Final next level of care: Home/Self Care Barriers to Discharge: No Barriers Identified   Patient Goals and CMS Choice        Discharge Placement                       Discharge Plan and Services                                     Social Determinants of Health (SDOH) Interventions     Readmission Risk Interventions No flowsheet data found.

## 2019-08-01 LAB — HM DIABETES EYE EXAM

## 2019-08-04 ENCOUNTER — Telehealth: Payer: Self-pay

## 2019-08-04 NOTE — Telephone Encounter (Signed)
Further refills are typically continued by the PCP

## 2019-08-04 NOTE — Telephone Encounter (Signed)
Daughter called to ask the doctor if he was going to continue prescribing Plavix? If so the patient needs 90 day prescriptions in order for ins to cover the cost.

## 2019-08-04 NOTE — Telephone Encounter (Signed)
Unable to get through.

## 2019-08-04 NOTE — Telephone Encounter (Signed)
Unable to get through, patient needs to contact PCP for further refills

## 2019-08-04 NOTE — Telephone Encounter (Signed)
Please see and advise

## 2019-08-05 ENCOUNTER — Telehealth: Payer: Self-pay | Admitting: Neurology

## 2019-08-05 ENCOUNTER — Other Ambulatory Visit: Payer: Self-pay | Admitting: Neurology

## 2019-08-05 MED ORDER — CLOPIDOGREL BISULFATE 75 MG PO TABS: 75.0000 mg | ORAL_TABLET | Freq: Every day | ORAL | 2 refills | Status: DC

## 2019-08-05 NOTE — Telephone Encounter (Signed)
Refilled Plavix.  It was prescribed when he had recent stroke.  He should be taking the Plavix along with the aspirin until June 3, at which point he can discontinue Plavix and continue aspirin 81mg  daily.  He has a follow up with me later in June.

## 2019-08-05 NOTE — Telephone Encounter (Signed)
Patient's daughter called wanting to know if the patient needs to continue taking Plavix. He is almost out of the medication. Patient was prescribed this medication in the ED.  [facility] [employer]

## 2019-08-05 NOTE — Telephone Encounter (Signed)
Pt daughter called Joseph Arellano should be taking the Plavix along with the aspirin until June 3, at which point he can discontinue Plavix and continue aspirin 81mg  daily

## 2019-08-13 ENCOUNTER — Telehealth: Payer: Self-pay

## 2019-08-13 NOTE — Telephone Encounter (Signed)
Called and spoke to daughter [name] regarding her concerns of not being able to obtain testing results for her father. She informed me that she is the Health Care Power of [employer] of her father. I explained to [name] that we do not have the POA or DPR on file and therefore would not be able to speak with her until we could get this information on file. She verbalized understanding and agreed to bring a copy of the Health Care Power of Attorney paperwork to add to his chart. She will also sign the DPR while she is at the office. Laslty, I informed her that while she is at the office that one of the medical assistants would be able to give her the results to her fathers recent heart monitor. She verbalized understanding.

## 2019-08-13 NOTE — Telephone Encounter (Signed)
-----   Message from Van Clines, MD sent at 08/12/2019  5:05 PM EDT ----- Pls let patient know heart monitor did not show any evidence of irregular rhythm. Thanks

## 2019-08-13 NOTE — Telephone Encounter (Signed)
Attempted to reach patient.  Daughter answered the phone and said patient could not hear and that I should tell her the results.  After reviewing the chart unclear if I was able to release the information.  Advised daughter results were on MyChart.  Patient daughter was very upset that she was not able to be the patients advocate in and out of the office.  I advised her I would forward her concerns to the appropriate personnel.

## 2019-08-14 ENCOUNTER — Encounter: Payer: Self-pay | Admitting: Gastroenterology

## 2019-09-16 ENCOUNTER — Other Ambulatory Visit: Payer: Self-pay

## 2019-09-16 ENCOUNTER — Encounter: Payer: Self-pay | Admitting: Gastroenterology

## 2019-09-16 ENCOUNTER — Ambulatory Visit: Payer: Medicare Other | Admitting: Gastroenterology

## 2019-09-16 VITALS — BP 130/60 | HR 100 | Temp 98.2°F | Ht 68.75 in | Wt 189.4 lb

## 2019-09-16 DIAGNOSIS — K5900 Constipation, unspecified: Secondary | ICD-10-CM | POA: Diagnosis not present

## 2019-09-16 DIAGNOSIS — R1310 Dysphagia, unspecified: Secondary | ICD-10-CM

## 2019-09-16 MED ORDER — CITRUCEL PO POWD: 1.0000 | Freq: Every day | ORAL | Status: AC

## 2019-09-16 NOTE — Progress Notes (Signed)
HPI: This is a   very pleasant 86 year old man who was referred to me by Reynold Bowen, MD  to evaluate dysphagia, abnormal bowel habits.    He has rather chronic constipation.  He will have a bowel movement every 7 to 10 days.  When he does have a BM it is usually quite loose.  He does not have any significant abdominal pains.  His weight has been overall stable.  He tells me his bowels been like this for quite a long time.  He has never seen blood in his stool.  Colon cancer does not run in his family  He has had minor intermittent nonprogressive solid food only dysphagia.  He does not have GERD, heartburn as long as he takes omeprazole once daily.  He is on the over-the-counter strength proton pump inhibitor.  He has significant neurologic disease.  Actually had a stroke March 2021.  Imaging at that time also showed advanced atrophy of white matter likely reflecting chronic microvascular ischemia.  He is on Plavix daily.  Old Data Reviewed: Blood work March 2021 shows normal complete metabolic profile, normal CBC.  March 2021 MRI of brain without contrast:  1. Acute/subacute nonhemorrhagic infarcts of the left occipital lobe as seen by CT. 2. Additional smaller cortical nonhemorrhagic infarcts are acute/subacute in the more superior and medial left occipital lobe. 3. Advanced atrophy and white matter disease is otherwise stable likely reflecting the sequela of chronic microvascular ischemia.  Review of systems: Pertinent positive and negative review of systems were noted in the above HPI section. All other review negative.   Past Medical History:  Diagnosis Date  . B12 deficiency   . Depression   . Diabetes ([place])   . GERD (gastroesophageal reflux disease)   . Hypercholesteremia   . Hypertension   . Peripheral neuropathy   . Pre-diabetes   . Stroke [facility])     Past Surgical History:  Procedure Laterality Date  . CATARACT EXTRACTION Bilateral     Current Outpatient  Medications  Medication Sig Dispense Refill  . Alogliptin Benzoate 25 MG TABS Take 25 mg by mouth daily.    Marland Kitchen aspirin EC 81 MG EC tablet Take 1 tablet (81 mg total) by mouth daily.    Marland Kitchen atorvastatin (LIPITOR) 20 MG tablet Take 1 tablet (20 mg total) by mouth daily at 6 PM. 30 tablet 2  . clopidogrel (PLAVIX) 75 MG tablet Take 1 tablet (75 mg total) by mouth daily. 30 tablet 2  . FIBER PO Take 1 capsule by mouth daily.     . insulin aspart (NOVOLOG FLEXPEN) 100 UNIT/ML FlexPen Inject 16 Units into the skin See admin instructions. Inject 16 units into the skin after breakfast and 16 units after supper    . Insulin Glargine (LANTUS SOLOSTAR) 100 UNIT/ML Solostar Pen Inject 36 Units into the skin daily after breakfast.    . losartan (COZAAR) 25 MG tablet Take 25 mg by mouth in the morning and at bedtime.     . magnesium oxide (MAG-OX) 400 MG tablet Take 400 mg by mouth daily as needed (for constipation).     . Omega-3 Fatty Acids (FISH OIL PO) Take 1 capsule by mouth daily.     Marland Kitchen omeprazole (PRILOSEC) 20 MG capsule Take 20 mg by mouth daily before breakfast.     . triamcinolone cream (KENALOG) 0.1 % Apply 1 application topically See admin instructions. Apply 1 application to affected areas daily as needed for itching    .  vitamin B-12 (CYANOCOBALAMIN) 1000 MCG tablet Take 1,000 mcg by mouth daily.     No current facility-administered medications for this visit.    Allergies as of 09/16/2019  . (No Known Allergies)    Family History  Problem Relation Age of Onset  . Hypertension Father   . Hypertension Mother     Social History   Socioeconomic History  . Marital status: Married    Spouse name: Not on file  . Number of children: 4  . Years of education: 5  . Highest education level: Not on file  Occupational History  . Occupation: retired    Comment: photography  Tobacco Use  . Smoking status: Former Games developer  . Smokeless tobacco: Never Used  Substance and Sexual Activity  .  Alcohol use: Yes    Comment: glass of wine every night (sometimes 2 max)  . Drug use: No  . Sexual activity: Not Currently    Partners: Male  Other Topics Concern  . Not on file  Social History Narrative   Right handed    Apartment   Drinks coffee q day   Social Determinants of Health   Financial Resource Strain:   . Difficulty of Paying Living Expenses:   Food Insecurity:   . Worried About Programme researcher, broadcasting/film/video in the Last Year:   . Barista in the Last Year:   Transportation Needs:   . Freight forwarder (Medical):   Marland Kitchen Lack of Transportation (Non-Medical):   Physical Activity:   . Days of Exercise per Week:   . Minutes of Exercise per Session:   Stress:   . Feeling of Stress :   Social Connections:   . Frequency of Communication with Friends and Family:   . Frequency of Social Gatherings with Friends and Family:   . Attends Religious Services:   . Active Member of Clubs or Organizations:   . Attends Banker Meetings:   Marland Kitchen Marital Status:   Intimate Partner Violence:   . Fear of Current or Ex-Partner:   . Emotionally Abused:   Marland Kitchen Physically Abused:   . Sexually Abused:      Physical Exam: Temp 98.2 F (36.8 C)   Ht 5' 8.75" (1.746 m) Comment: height measured without shoes  Wt 189 lb 6 oz (85.9 kg)   BMI 28.17 kg/m  Constitutional: generally well-appearing Psychiatric: alert and oriented x3 Eyes: extraocular movements intact Mouth: oral pharynx moist, no lesions Neck: supple no lymphadenopathy Cardiovascular: heart regular rate and rhythm Lungs: clear to auscultation bilaterally Abdomen: soft, nontender, nondistended, no obvious ascites, no peritoneal signs, normal bowel sounds Extremities: no lower extremity edema bilaterally Skin: no lesions on visible extremities   Assessment and plan: 86 y.o. male with abnormal bowel habits, minor intermittent nonprogressive solid food dysphagia  First I explained to him that oftentimes both of  his complaints will lead to eventual endoscopic testing with colonoscopy, EGD.  At his advanced age, with his underlying neurologic issues including a stroke just 2 months ago I explained to him and his daughter that I think it is safest to try to avoid any invasive testing if it is possible.  To this end I recommend a trial of fiber supplements with Citrucel orange flavored powder.  I also recommended a barium esophagram as a noninvasive way to check for significant esophageal stricturing, signs of tumors or cancers.  He will return to see me in 6 to 8 weeks and sooner if needed.  Please see the "Patient Instructions" section for addition details about the plan.   Rob Bunting, MD [facility] 09/16/2019, 10:26 AM  Cc: Adrian Prince, MD  Total time on date of encounter was 45  minutes (this included time spent preparing to see the patient reviewing records; obtaining and/or reviewing separately obtained history; performing a medically appropriate exam and/or evaluation; counseling and educating the patient and family if present; ordering medications, tests or procedures if applicable; and documenting clinical information in the health record).

## 2019-09-16 NOTE — Patient Instructions (Addendum)
If you are age 84 or older, your body mass index should be between 23-30. Your Body mass index is 28.17 kg/m. If this is out of the aforementioned range listed, please consider follow up with your Primary Care Provider.  If you are age 38 or younger, your body mass index should be between 19-25. Your Body mass index is 28.17 kg/m. If this is out of the aformentioned range listed, please consider follow up with your Primary Care Provider.   You have been scheduled for a Barium Esophogram at [facility] (1st floor of the hospital) on 09-29-19 at 10:30am. Please arrive 15 minutes prior to your appointment for registration. Make certain not to have anything to eat or drink 3 hours prior to your test. If you need to reschedule for any reason, please contact radiology at [phone] to do so. ______________________________________________________ A barium swallow is an examination that concentrates on views of the esophagus. This tends to be a double contrast exam (barium and two liquids which, when combined, create a gas to distend the wall of the oesophagus) or single contrast (non-ionic iodine based). The study is usually tailored to your symptoms so a good history is essential. Attention is paid during the study to the form, structure and configuration of the esophagus, looking for functional disorders (such as aspiration, dysphagia, achalasia, motility and reflux)  EXAMINATION You may be asked to change into a gown, depending on the type of swallow being performed. A radiologist and radiographer will perform the procedure. The radiologist will advise you of the type of contrast selected for your procedure and direct you during the exam. You will be asked to stand, sit or lie in several different positions and to hold a small amount of fluid in your mouth before being asked to swallow while the imaging is performed .In some instances you may be asked to swallow barium coated marshmallows to  assess the motility of a solid food bolus. The exam can be recorded as a digital or video fluoroscopy procedure.  POST PROCEDURE It will take 1-2 days for the barium to pass through your system. To facilitate this, it is important, unless otherwise directed, to increase your fluids for the next 24-48hrs and to resume your normal diet.   This test typically takes about 30 minutes to perform. ______________________________________________________  Please purchase the following medications over the counter and take as directed:  Please start taking citrucel (orange flavored) powder fiber supplement.  This may cause some bloating at first but that usually goes away. Begin with a small spoonful and work your way up to a large, heaping spoonful daily over a week.  You will follow up with office on 11-04-19 at 10:50am.  Please arrive 10 minutes prior to the appointment.  Thank you for entrusting me with your care and choosing [facility].  Dr Christella Hartigan

## 2019-09-26 ENCOUNTER — Other Ambulatory Visit: Payer: Self-pay

## 2019-09-26 NOTE — Patient Outreach (Signed)
First telephone outreach attempt to obtain mRS. No answer. Left message for returned call.  Gillian Meeuwsen THN-Care Management Assistant [phone] 

## 2019-09-27 ENCOUNTER — Other Ambulatory Visit: Payer: Self-pay

## 2019-09-27 ENCOUNTER — Emergency Department (HOSPITAL_COMMUNITY)
Admission: EM | Admit: 2019-09-27 | Discharge: 2019-09-28 | Disposition: A | Discharge status: Home / Self Care | Payer: Medicare Other | Attending: Emergency Medicine | Admitting: Emergency Medicine

## 2019-09-27 ENCOUNTER — Encounter (HOSPITAL_COMMUNITY): Payer: Self-pay | Admitting: *Deleted

## 2019-09-27 DIAGNOSIS — Z7982 Long term (current) use of aspirin: Secondary | ICD-10-CM | POA: Diagnosis not present

## 2019-09-27 DIAGNOSIS — I10 Essential (primary) hypertension: Secondary | ICD-10-CM | POA: Diagnosis not present

## 2019-09-27 DIAGNOSIS — Z794 Long term (current) use of insulin: Secondary | ICD-10-CM | POA: Diagnosis not present

## 2019-09-27 DIAGNOSIS — E119 Type 2 diabetes mellitus without complications: Secondary | ICD-10-CM | POA: Diagnosis not present

## 2019-09-27 DIAGNOSIS — M79651 Pain in right thigh: Secondary | ICD-10-CM | POA: Insufficient documentation

## 2019-09-27 DIAGNOSIS — Z87891 Personal history of nicotine dependence: Secondary | ICD-10-CM | POA: Diagnosis not present

## 2019-09-27 DIAGNOSIS — Z79899 Other long term (current) drug therapy: Secondary | ICD-10-CM | POA: Insufficient documentation

## 2019-09-27 NOTE — ED Triage Notes (Signed)
The pt is c/o a painful rt knee and hi and buttocks for 2 days.  He is saying he has had a stroke  He uses a cane  To walk and for the past 2 days his rt leg has been painful  No other pains

## 2019-09-27 NOTE — ED Notes (Signed)
This patient states "I want to file a complaint and leave. i've seen such inefficiency here i've had to wait 3 hours to be seen." explained to patient that they base it off of wait time and lab work and we had a room ready for him. Registration and this NT trying to get him to agree to stay. After wheeling him back to room he refused to get into gown. Patient states "I just need to be checked to see if I had a stroke I had two last week in the same day that ought to be simple enough."

## 2019-09-28 ENCOUNTER — Emergency Department (HOSPITAL_COMMUNITY): Payer: Medicare Other

## 2019-09-28 ENCOUNTER — Ambulatory Visit (HOSPITAL_BASED_OUTPATIENT_CLINIC_OR_DEPARTMENT_OTHER)
Admission: RE | Admit: 2019-09-28 | Discharge: 2019-09-28 | Disposition: A | Discharge status: Home / Self Care | Payer: Medicare Other | Source: Ambulatory Visit | Attending: Emergency Medicine | Admitting: Emergency Medicine

## 2019-09-28 ENCOUNTER — Other Ambulatory Visit: Payer: Self-pay

## 2019-09-28 DIAGNOSIS — M79604 Pain in right leg: Secondary | ICD-10-CM

## 2019-09-28 MED ORDER — HYDROCODONE-ACETAMINOPHEN 5-325 MG PO TABS
1.0000 | ORAL_TABLET | Freq: Once | ORAL | Status: AC
  Administered 2019-09-28: 1 via ORAL
  Filled 2019-09-28: qty 1

## 2019-09-28 NOTE — Progress Notes (Signed)
VASCULAR LAB PRELIMINARY  PRELIMINARY  PRELIMINARY  PRELIMINARY  Right lower extremity venous duplex completed.    Preliminary report:  See CV proc for preliminary results.  Jerae Izard, RVT 09/28/2019, 12:01 PM

## 2019-09-28 NOTE — ED Notes (Signed)
Pt. succesfully Ambulated w/walker. Normal for baseline. No signs of pain or weakness. Pt. Reported feeling dizzy.

## 2019-09-28 NOTE — Discharge Instructions (Addendum)
Please follow-up for blood clot ultrasound.  If this is negative and he continues to have pain, please follow-up with your primary doctor in 1 week  SEEK IMMEDIATE MEDICAL ATTENTION IF: New numbness, tingling, weakness, or problem with the use of your arms or legs.  Severe back pain not relieved with medications.  Change in bowel or bladder control (if you lose control of stool or urine, or if you are unable to urinate) Increasing pain in any areas of the body (such as chest or abdominal pain).  Shortness of breath, dizziness or fainting.  Nausea (feeling sick to your stomach), vomiting, fever, or sweats.

## 2019-09-28 NOTE — ED Notes (Signed)
Pt ambulated out into hall using a walker. Pt reports some dizziness otherwise normal for baseline, pt uses a cane and walker at home.

## 2019-09-28 NOTE — ED Provider Notes (Signed)
[facility] EMERGENCY DEPARTMENT Provider Note   CSN: [id number] Arrival date & time: 09/27/19  2033     History Chief Complaint  Patient presents with  . Hip Pain    Joseph Arellano is a 86 y.o. male.  The history is provided by the patient, a caregiver and a relative.  Hip Pain This is a new problem. The current episode started more than 2 days ago. The problem occurs daily. The problem has been gradually worsening. Pertinent negatives include no chest pain, no abdominal pain, no headaches and no shortness of breath. The symptoms are aggravated by walking. The symptoms are relieved by rest.  Patient with previous history of stroke, hypertension, hypercholesterolemia presents with right thigh and lower leg pain.  Denies trauma.  He reports this is been ongoing for the past 3 days.  No falls.  No new weakness.  Denies back pain.  He reports it now hurts to walk. He also reports elevated blood pressure.  His nursing facility requested an evaluation due to his high blood pressure Patient with minimal residual deficits from previous stroke   He is accompanied by his daughter Past Medical History:  Diagnosis Date  . B12 deficiency   . Depression   . Diabetes ([facility])   . GERD (gastroesophageal reflux disease)   . Hypercholesteremia   . Hypertension   . Peripheral neuropathy   . Pre-diabetes   . Stroke [facility])     Patient Active Problem List   Diagnosis Date Noted  . Acute ischemic stroke (H[facility]) 07/16/2019  . Acute CVA (cerebrovascular accident) ([facility]) 07/15/2019  . Hyperlipidemia 06/24/2019  . Diabetes mellitus type II, controlled ([facility]) 06/24/2019  . B12 deficiency 06/24/2019  . GERD (gastroesophageal reflux disease) 06/24/2019  . Depression 06/24/2019  . Stroke ([facility]) 06/23/2019  . Diabetic peripheral neuropathy ([facility]) 01/09/2014    Past Surgical History:  Procedure Laterality Date  . APPENDECTOMY    . CATARACT EXTRACTION Bilateral        Family History    Problem Relation Age of Onset  . Hypertension Father   . Lung cancer Father   . Diabetes Father   . Hypertension Mother   . Cerebral aneurysm Daughter     Social History   Tobacco Use  . Smoking status: Former Smoker    Types: Cigarettes  . Smokeless tobacco: Never Used  Substance Use Topics  . Alcohol use: Yes    Comment: glass of wine every night (sometimes 2 max)  . Drug use: No    Home Medications Prior to Admission medications   Medication Sig Start Date End Date Taking? Authorizing Provider  aspirin EC 81 MG EC tablet Take 1 tablet (81 mg total) by mouth daily. 06/25/19  Yes Layne Benton, NP  atorvastatin (LIPITOR) 20 MG tablet Take 1 tablet (20 mg total) by mouth daily at 6 PM. 06/24/19  Yes Layne Benton, NP  clopidogrel (PLAVIX) 75 MG tablet Take 1 tablet (75 mg total) by mouth daily. 08/05/19  Yes Jaffe, Adam R, DO  FIBER PO Take 1 capsule by mouth daily.    Yes [provider]  insulin aspart (NOVOLOG FLEXPEN) 100 UNIT/ML FlexPen Inject 16 Units into the skin See admin instructions. Inject 16 units into the skin after breakfast and 16 units after supper   Yes [provider]  Insulin Glargine (LANTUS SOLOSTAR) 100 UNIT/ML Solostar Pen Inject 18 Units into the skin 2 (two) times daily.    Yes [provider]  losartan (COZAAR) 50 MG tablet Take 50 mg by mouth in the morning and at bedtime.   Yes [provider]  Omega-3 Fatty Acids (FISH OIL PO) Take 1 capsule by mouth daily.    Yes [provider]  omeprazole (PRILOSEC) 20 MG capsule Take 20 mg by mouth daily before breakfast.    Yes [provider]  triamcinolone cream (KENALOG) 0.1 % Apply 1 application topically See admin instructions. Apply 1 application to affected areas daily as needed for itching 06/25/19  Yes [provider]  vitamin B-12 (CYANOCOBALAMIN) 1000 MCG tablet Take 1,000 mcg by mouth daily.   Yes [provider]  methylcellulose  (CITRUCEL) oral powder Take 1 packet by mouth daily. Patient not taking: Reported on 09/28/2019 09/16/19   Milus Banister, MD    Allergies    Patient has no known allergies.  Review of Systems   Review of Systems  Constitutional: Negative for fever.  Respiratory: Negative for shortness of breath.   Cardiovascular: Negative for chest pain.  Gastrointestinal: Negative for abdominal pain.  Musculoskeletal: Positive for arthralgias and myalgias.  Neurological: Negative for weakness and headaches.  All other systems reviewed and are negative.   Physical Exam Updated Vital Signs BP (!) 180/80 (BP Location: Left Arm)   Pulse 64   Temp 98.3 F (36.8 C) (Oral)   Resp 18   Ht 1.778 m (5\' 10" )   Wt 83.9 kg   SpO2 100%   BMI 26.54 kg/m   Physical Exam CONSTITUTIONAL: Elderly, no acute distress HEAD: Normocephalic/atraumatic EYES: EOMI/PERRL ENMT: Mucous membranes moist NECK: supple no meningeal signs SPINE/BACK:entire spine nontender CV: S1/S2 noted, no murmurs/rubs/gallops noted LUNGS: Lungs are clear to auscultation bilaterally, no apparent distress ABDOMEN: soft, nontender, no rebound or guarding, bowel sounds noted throughout abdomen GU:no cva tenderness NEURO: Pt is awake/alert/appropriate, moves all extremitiesx4.  No facial droop.  No arm drift.  No drift of left leg.  Due to pain patient is unable to fully lift right leg off bed.  He is able move his toes and his ankle in the right leg without any difficulty. EXTREMITIES: pulses normal/equal, full ROM distal pulses equal and intact.  There is tenderness to right thigh and right tibia surface.  No deformities.  No edema. SKIN: warm, color normal PSYCH: no abnormalities of mood noted, alert and oriented to situation  ED Results / Procedures / Treatments   Labs (all labs ordered are listed, but only abnormal results are displayed) Labs Reviewed - No data to display  EKG EKG Interpretation  Date/Time:  Saturday Sep 27 2019 20:43:55 EDT Ventricular Rate:  68 PR Interval:  222 QRS Duration: 160 QT Interval:  438 QTC Calculation: 465 R Axis:   -74 Text Interpretation: Sinus rhythm with 1st degree A-V block Right bundle branch block Left anterior fascicular block ** Bifascicular block ** Minimal voltage criteria for LVH, may be normal variant ( R in aVL ) Abnormal ECG No significant change since last tracing Interpretation limited secondary to artifact Confirmed by Ripley Fraise [phone]) on 09/27/2019 11:12:01 PM   Radiology DG Lumbar Spine Complete  Result Date: 09/28/2019 CLINICAL DATA:  Pain EXAM: LUMBAR SPINE - COMPLETE 4+ VIEW COMPARISON:  None. FINDINGS: There is no evidence of lumbar spine fracture. Alignment is normal. Disc height loss with facet arthrosis most notable at L4-L5 and L5-S1. There anterior osteophytes most notable at L2-L3. Scattered vascular calcifications are noted. There is diffuse osteopenia. IMPRESSION: No fracture or malalignment. Electronically Signed  By: Jonna Clark M.D.   On: 09/28/2019 01:38   DG Tibia/Fibula Right  Result Date: 09/28/2019 CLINICAL DATA:  Pain EXAM: RIGHT TIBIA AND FIBULA - 2 VIEW COMPARISON:  None. FINDINGS: There is a well corticated partially visualized lucency seen at the distal fibula which could represent a prior partially united injury. No overlying soft tissue swelling is seen. No acute fracture or dislocation. Enthesophytes seen at the patellar tendon. IMPRESSION: No definite acute osseous abnormality. Electronically Signed   By: Jonna Clark M.D.   On: 09/28/2019 01:35   DG Femur Min 2 Views Right  Result Date: 09/28/2019 CLINICAL DATA:  Pain EXAM: RIGHT FEMUR 2 VIEWS COMPARISON:  None. FINDINGS: No fracture or dislocation. There is moderate right hip osteoarthritis with superior joint space loss and marginal osteophyte formation. There is diffuse osteopenia. No focal overlying soft tissue swelling. IMPRESSION: No acute osseous abnormality.  Electronically Signed   By: Jonna Clark M.D.   On: 09/28/2019 01:36    Procedures Procedures   Medications Ordered in ED Medications  HYDROcodone-acetaminophen (NORCO/VICODIN) 5-325 MG per tablet 1 tablet (has no administration in time range)    ED Course  I have reviewed the triage vital signs and the nursing notes.  Pertinent labs & imaging results that were available during my care of the patient were reviewed by me and considered in my medical decision making (see chart for details).    MDM Rules/Calculators/A&P                      12:48 AM Patient presents with atraumatic right leg pain for the past 3 days.  There is no signs of cellulitis, no signs of DVT.  No deformities.  He does have tenderness on exam.  Denies any back pain, but does exhibit some symptoms of radiculopathy.  Patient was concerned he had a stroke.  There is no evidence of acute stroke at this time.  We will start with x-ray of lumbar spine and right lower extremity.  We will also treat his pain.   Patient improved.  He can ambulate. X-rays negative He declines further pain medicines Will discharge home.  Daughter is requesting DVT study.  This was ordered.  Although clinically he had no signs of DVT.  Will defer anticoagulation Advise follow-up with PCP, this could be lumbosacral radiculopathy. Final Clinical Impression(s) / ED Diagnoses Final diagnoses:  Right thigh pain    Rx / DC Orders ED Discharge Orders    None       Zadie Rhine, MD 09/28/19 [phone]

## 2019-09-29 ENCOUNTER — Ambulatory Visit (HOSPITAL_COMMUNITY): Payer: Medicare Other

## 2019-10-01 ENCOUNTER — Other Ambulatory Visit: Payer: Self-pay

## 2019-10-01 NOTE — Patient Outreach (Signed)
Second telephone outreach attempt to obtain mRS. No answer. Left message for returned call.  Joseph Arellano THN-Care Management Assistant [phone] 

## 2019-10-06 ENCOUNTER — Other Ambulatory Visit: Payer: Self-pay

## 2019-10-06 NOTE — Patient Outreach (Signed)
3 outreach attempts were completed to obtain mRs. mRs could not be obtained because patient never returned my calls. mRs=7    Puja Caffey Care Management Assistant [phone] 

## 2019-10-23 ENCOUNTER — Other Ambulatory Visit: Payer: Self-pay | Admitting: Neurology

## 2019-11-04 ENCOUNTER — Ambulatory Visit: Payer: Medicare Other | Admitting: Gastroenterology

## 2019-11-06 NOTE — Progress Notes (Signed)
NEUROLOGY FOLLOW UP OFFICE NOTE  GADGE HERMIZ [id number]  HISTORY OF PRESENT ILLNESS: Joseph Arellano. Shaff is an 86 year old right-handed white male with type 2 diabetes mellitus with peripheral neuropathy, HTN, and HLD who follows up for stroke.  He is accompanied by his daughter who supplements history.  Hospital records reviewed.  MRI and CTA from March personally reviewed.  UPDATE: Since last visit, he was admitted to [facility] on 07/15/2019 for another stroke.  He had developed right sided peripheral vision loss.  MRI of brain showed left PCA territory infarct.  CTA of head and neck revealed severe left PCA stenosis.  He was discharged on ASA 81mg  daily and Plavix 75mg  daily.  He stumbled last week and fell tripped on cane.  Bumped head.    HISTORY: He was admitted to [facility] on 06/23/2019 after sudden onset of right sided upper and lower extremity weakness.  His leg gave way and he fell.  On arrival in ED, he presented with right facial droop, dysarthria, ataxia and right drift.  NIHSS was 6.  CT hed showed no acute intracranial abnormality.  CTA of head  And neck showed intracranial atherosclerosis with severe proximal left PCA and left A2 stenosis as well as mild right proximal vertebral artery stenosis, but no emergent large vessel occlusion.  He received IV tPA.  MRI of brain showed chronic small vessel ischemic changes with remote lacunar infarcts in the periventricular white matter but no acute infarct.  Echocardiogram showed EF 60-65% with no cardiac source of embolus.  LDL was 114.  Hgb A1c was 6.9.  He was not on antithrombin therapy prior to admission.  At discharge, he was placed on ASA 81mg  and Plavix 75mg  daily for 3 weeks, followed by ASA alone.  He was continued on statin and Cholestid.  Outpatient 30 day cardiac event monitor was recommended.    PAST MEDICAL HISTORY: Past Medical History:  Diagnosis Date  . B12 deficiency   . Depression   . Diabetes (HCC)    . GERD (gastroesophageal reflux disease)   . Hypercholesteremia   . Hypertension   . Peripheral neuropathy   . Pre-diabetes   . Stroke [facility])     MEDICATIONS: Current Outpatient Medications on File Prior to Visit  Medication Sig Dispense Refill  . aspirin EC 81 MG EC tablet Take 1 tablet (81 mg total) by mouth daily.    08/21/2019 atorvastatin (LIPITOR) 20 MG tablet Take 1 tablet (20 mg total) by mouth daily at 6 PM. 30 tablet 2  . clopidogrel (PLAVIX) 75 MG tablet Take 1 tablet (75 mg total) by mouth daily. 30 tablet 2  . FIBER PO Take 1 capsule by mouth daily.     . insulin aspart (NOVOLOG FLEXPEN) 100 UNIT/ML FlexPen Inject 16 Units into the skin See admin instructions. Inject 16 units into the skin after breakfast and 16 units after supper    . Insulin Glargine (LANTUS SOLOSTAR) 100 UNIT/ML Solostar Pen Inject 18 Units into the skin 2 (two) times daily.     losartan (COZAAR) 50 MG tablet Take 50 mg by mouth in the morning and at bedtime.    . methylcellulose (CITRUCEL) oral powder Take 1 packet by mouth daily. (Patient not taking: Reported on 09/28/2019)    . Omega-3 Fatty Acids (FISH OIL PO) Take 1 capsule by mouth daily.     [facility] omeprazole (PRILOSEC) 20 MG capsule Take 20 mg by mouth daily before breakfast.     .  triamcinolone cream (KENALOG) 0.1 % Apply 1 application topically See admin instructions. Apply 1 application to affected areas daily as needed for itching    . vitamin B-12 (CYANOCOBALAMIN) 1000 MCG tablet Take 1,000 mcg by mouth daily.     No current facility-administered medications on file prior to visit.    ALLERGIES: No Known Allergies  FAMILY HISTORY: Family History  Problem Relation Age of Onset  . Hypertension Father   . Lung cancer Father   . Diabetes Father   . Hypertension Mother   . Cerebral aneurysm Daughter     SOCIAL HISTORY: Social History   Socioeconomic History  . Marital status: Married    Spouse name: Not on file  . Number of children: 4  .  Years of education: 5  . Highest education level: Not on file  Occupational History  . Occupation: retired    Comment: photography  Tobacco Use  . Smoking status: Former Smoker    Types: Cigarettes  . Smokeless tobacco: Never Used  Vaping Use  . Vaping Use: Never used  Substance and Sexual Activity  . Alcohol use: Yes    Comment: glass of wine every night (sometimes 2 max)  . Drug use: No  . Sexual activity: Not Currently    Partners: Male  Other Topics Concern  . Not on file  Social History Narrative   Right handed    Apartment   Drinks coffee q day   Social Determinants of Health   Financial Resource Strain:   . Difficulty of Paying Living Expenses:   Food Insecurity:   . Worried About Programme researcher, broadcasting/film/video in the Last Year:   . Barista in the Last Year:   Transportation Needs:   . Freight forwarder (Medical):   Marland Kitchen Lack of Transportation (Non-Medical):   Physical Activity:   . Days of Exercise per Week:   . Minutes of Exercise per Session:   Stress:   . Feeling of Stress :   Social Connections:   . Frequency of Communication with Friends and Family:   . Frequency of Social Gatherings with Friends and Family:   . Attends Religious Services:   . Active Member of Clubs or Organizations:   . Attends Banker Meetings:   Marland Kitchen Marital Status:   Intimate Partner Violence:   . Fear of Current or Ex-Partner:   . Emotionally Abused:   Marland Kitchen Physically Abused:   . Sexually Abused:     PHYSICAL EXAM: Blood pressure (!) 145/75, pulse 84, height 5\' 10"  (1.778 m), weight 192 lb 6.4 oz (87.3 kg), SpO2 98 %. General: No acute distress.  Patient appears well-groomed.   Head:  Normocephalic/atraumatic Eyes:  Fundi examined but not visualized Heart:  Regular rate and rhythm Lungs:  Clear to auscultation bilaterally Neurological Exam: alert and oriented to person, place, and time. Attention span and concentration intact, recent and remote memory intact, fund of  knowledge intact.  Speech fluent and not dysarthric, language intact.  CN II-XII intact. Bulk and tone normal, muscle strength 5/5 throughout.  Sensation to light touch, temperature and vibration intact.  Deep tendon reflexes 2+ throughout, toes downgoing.  Finger to nose and heel to shin testing intact.  Antalgic gait.  Uses rolling walker.  IMPRESSION: 1.  Left occipital lobe infarcts secondary to severe left PCA stenosis 2.  Hypertension 3.  Hyperlipidemia 4.  Type 2 diabetes mellitus  PLAN: 1.  ASA 81mg  daily for secondary stroke prevention.  Will discontinue Plavix.   2.  Atorvastatin (LDL goal less than 70) 3.  Blood pressure goal 166-060 systolic 4.  Glycemic control (Hgb A1c goal less than 7) 5.  Mediterranean diet 6.  Follow up in 6 months.  Metta Clines, DO  CC:  Reynold Bowen, MD

## 2019-11-10 ENCOUNTER — Encounter: Payer: Self-pay | Admitting: Neurology

## 2019-11-10 ENCOUNTER — Other Ambulatory Visit: Payer: Self-pay

## 2019-11-10 ENCOUNTER — Ambulatory Visit (INDEPENDENT_AMBULATORY_CARE_PROVIDER_SITE_OTHER): Payer: Medicare Other | Admitting: Neurology

## 2019-11-10 VITALS — BP 145/75 | HR 84 | Ht 70.0 in | Wt 192.4 lb

## 2019-11-10 DIAGNOSIS — E785 Hyperlipidemia, unspecified: Secondary | ICD-10-CM

## 2019-11-10 DIAGNOSIS — I63532 Cerebral infarction due to unspecified occlusion or stenosis of left posterior cerebral artery: Secondary | ICD-10-CM | POA: Diagnosis not present

## 2019-11-10 DIAGNOSIS — E1159 Type 2 diabetes mellitus with other circulatory complications: Secondary | ICD-10-CM | POA: Diagnosis not present

## 2019-11-10 DIAGNOSIS — I10 Essential (primary) hypertension: Secondary | ICD-10-CM | POA: Diagnosis not present

## 2019-11-10 NOTE — Patient Instructions (Signed)
1. May discontinue plavix.  Continue aspirin 81mg  daily 2.  Continue all other medications as per Dr. 3.  Mediterranean diet 4.  Follow up in 6 months   Mediterranean Diet A Mediterranean diet refers to food and lifestyle choices that are based on the traditions of countries located on the [place]. This way of eating has been shown to help prevent certain conditions and improve outcomes for people who have chronic diseases, like kidney disease and heart disease. What are tips for following this plan? Lifestyle  Cook and eat meals together with your family, when possible.  Drink enough fluid to keep your urine clear or pale yellow.  Be physically active every day. This includes: ? Aerobic exercise like running or swimming. ? Leisure activities like gardening, walking, or housework.  Get 7-8 hours of sleep each night.  If recommended by your health care provider, drink red wine in moderation. This means 1 glass a day for nonpregnant women and 2 glasses a day for men. A glass of wine equals 5 oz (150 mL). Reading food labels   Check the serving size of packaged foods. For foods such as rice and pasta, the serving size refers to the amount of cooked product, not dry.  Check the total fat in packaged foods. Avoid foods that have saturated fat or trans fats.  Check the ingredients list for added sugars, such as corn syrup. Shopping  At the grocery store, buy most of your food from the areas near the walls of the store. This includes: ? Fresh fruits and vegetables (produce). ? Grains, beans, nuts, and seeds. Some of these may be available in unpackaged forms or large amounts (in bulk). ? Fresh seafood. ? Poultry and eggs. ? Low-fat dairy products.  Buy whole ingredients instead of prepackaged foods.  Buy fresh fruits and vegetables in-season from local farmers markets.  Buy frozen fruits and vegetables in resealable bags.  If you do not have access to quality  fresh seafood, buy precooked frozen shrimp or canned fish, such as tuna, salmon, or sardines.  Buy small amounts of raw or cooked vegetables, salads, or olives from the deli or salad bar at your store.  Stock your pantry so you always have certain foods on hand, such as olive oil, canned tuna, canned tomatoes, rice, pasta, and beans. Cooking  Cook foods with extra-virgin olive oil instead of using butter or other vegetable oils.  Have meat as a side dish, and have vegetables or grains as your main dish. This means having meat in small portions or adding small amounts of meat to foods like pasta or stew.  Use beans or vegetables instead of meat in common dishes like chili or lasagna.  Experiment with different cooking methods. Try roasting or broiling vegetables instead of steaming or sauteing them.  Add frozen vegetables to soups, stews, pasta, or rice.  Add nuts or seeds for added healthy fat at each meal. You can add these to yogurt, salads, or vegetable dishes.  Marinate fish or vegetables using olive oil, lemon juice, garlic, and fresh herbs. Meal planning   Plan to eat 1 vegetarian meal one day each week. Try to work up to 2 vegetarian meals, if possible.  Eat seafood 2 or more times a week.  Have healthy snacks readily available, such as: ? Vegetable sticks with hummus. ? [employer] yogurt. ? Fruit and nut trail mix.  Eat balanced meals throughout the week. This includes: ? Fruit: 2-3 servings a day ?  Vegetables: 4-5 servings a day ? Low-fat dairy: 2 servings a day ? Fish, poultry, or lean meat: 1 serving a day ? Beans and legumes: 2 or more servings a week ? Nuts and seeds: 1-2 servings a day ? Whole grains: 6-8 servings a day ? Extra-virgin olive oil: 3-4 servings a day  Limit red meat and sweets to only a few servings a month What are my food choices?  Mediterranean diet ? Recommended  Grains: Whole-grain pasta. Brown rice. Bulgar wheat. Polenta. Couscous.  Whole-wheat bread. [place].  Vegetables: Artichokes. Beets. Broccoli. Cabbage. Carrots. Eggplant. Green beans. Chard. Kale. Spinach. Onions. Leeks. Peas. Squash. Tomatoes. Peppers. Radishes.  Fruits: Apples. Apricots. Avocado. Berries. Bananas. Cherries. Dates. Figs. Grapes. Lemons. Melon. Oranges. Peaches. Plums. Pomegranate.  Meats and other protein foods: Beans. Almonds. Sunflower seeds. Pine nuts. Peanuts. [place]. Salmon. Scallops. Shrimp. [place]. Tilapia. Clams. Oysters. Eggs.  Dairy: Low-fat milk. Cheese. Greek yogurt.  Beverages: Water. Red wine. Herbal tea.  Fats and oils: Extra virgin olive oil. Avocado oil. Grape seed oil.  Sweets and desserts: [place] yogurt with honey. Baked apples. Poached pears. Trail mix.  Seasoning and other foods: Basil. Cilantro. Coriander. Cumin. Mint. Parsley. Sage. Rosemary. Tarragon. Garlic. Oregano. Thyme. Pepper. Balsalmic vinegar. Tahini. Hummus. Tomato sauce. Olives. Mushrooms. ? Limit these  Grains: Prepackaged pasta or rice dishes. Prepackaged cereal with added sugar.  Vegetables: Deep fried potatoes (french fries).  Fruits: Fruit canned in syrup.  Meats and other protein foods: Beef. Pork. Lamb. Poultry with skin. Hot dogs. Berniece Salines.  Dairy: Ice cream. Sour cream. Whole milk.  Beverages: Juice. Sugar-sweetened soft drinks. Beer. Liquor and spirits.  Fats and oils: Butter. Canola oil. Vegetable oil. Beef fat (tallow). Lard.  Sweets and desserts: Cookies. Cakes. Pies. Candy.  Seasoning and other foods: Mayonnaise. Premade sauces and marinades. The items listed may not be a complete list. Talk with your dietitian about what dietary choices are right for you. Summary  The Mediterranean diet includes both food and lifestyle choices.  Eat a variety of fresh fruits and vegetables, beans, nuts, seeds, and whole grains.  Limit the amount of red meat and sweets that you eat.  Talk with your health care provider about whether it is safe  for you to drink red wine in moderation. This means 1 glass a day for nonpregnant women and 2 glasses a day for men. A glass of wine equals 5 oz (150 mL). This information is not intended to replace advice given to you by your health care provider. Make sure you discuss any questions you have with your health care provider. Document Revised: 12/30/2015 Document Reviewed: 12/23/2015 Elsevier Patient Education  [place].

## 2020-04-26 ENCOUNTER — Ambulatory Visit: Payer: Medicare Other | Admitting: Neurology

## 2020-04-26 NOTE — Progress Notes (Signed)
NEUROLOGY FOLLOW UP OFFICE NOTE  EWALD BEG [id number]   Subjective:  Joseph Arellano is an 89 year oldright-handed white male with type 2 diabetes mellitus with peripheral neuropathy, HTN, and HLD who follows up for stroke.  He is accompanied by his son-in-law who supplements history.   UPDATE: Current medications:  ASA 81mg  daily, atorvastatin 20mg , Novolog, Lantus, losartan  He has been feeling well.  Has upcoming appointment for eye exam.    His last A1c was around 6.9-7.1.  Over the past month, he has had elevated sugars.    HISTORY: He was admitted to [facility] on 06/23/2019 after sudden onset of right sidedupper and lower extremityweakness. His leg gave way and he fell. On arrival in ED, he presented with right facial droop, dysarthria, ataxia and right drift. NIHSS was 6. CT hed showed no acute intracranial abnormality. CTA of head And neck showed intracranial atherosclerosis with severe proximal left PCA and left A2 stenosis as well as mild right proximal vertebral artery stenosis, but no emergent large vessel occlusion. He received IV tPA. MRI of brain showed chronic small vessel ischemic changes with remote lacunar infarcts in the periventricular white matter but no acute infarct. Echocardiogram showed EF 60-65% with no cardiac source of embolus. LDL was 114. Hgb A1c was 6.9. He was not on antithrombin therapy prior to admission. At discharge, he was placed on ASA 81mg  and Plavix 75mg  daily for 3 weeks, followed by ASA alone. He was continued on statin and Cholestid.   He was admitted to [facility] on 07/15/2019 for another stroke.  He had developed right sided peripheral vision loss.  MRI of brain showed left PCA territory infarct.  CTA of head and neck revealed severe left PCA stenosis.  He was discharged on ASA 81mg  daily and Plavix 75mg  daily.  PAST MEDICAL HISTORY: Past Medical History:  Diagnosis Date  . B12 deficiency   . Depression    . Diabetes (HCC)   . GERD (gastroesophageal reflux disease)   . Hypercholesteremia   . Hypertension   . Peripheral neuropathy   . Pre-diabetes   . Stroke [facility])     MEDICATIONS: Current Outpatient Medications on File Prior to Visit  Medication Sig Dispense Refill  . aspirin EC 81 MG EC tablet Take 1 tablet (81 mg total) by mouth daily.    atorvastatin (LIPITOR) 20 MG tablet Take 1 tablet (20 mg total) by mouth daily at 6 PM. 30 tablet 2  . FIBER PO Take 1 capsule by mouth daily.     . insulin aspart (NOVOLOG FLEXPEN) 100 UNIT/ML FlexPen Inject 16 Units into the skin See admin instructions. Inject 16 units into the skin after breakfast and 16 units after supper    . Insulin Glargine (LANTUS SOLOSTAR) 100 UNIT/ML Solostar Pen Inject 18 Units into the skin 2 (two) times daily.     [facility] losartan (COZAAR) 50 MG tablet Take 50 mg by mouth in the morning and at bedtime.    . methylcellulose (CITRUCEL) oral powder Take 1 packet by mouth daily.    . Omega-3 Fatty Acids (FISH OIL PO) Take 1 capsule by mouth daily.     09/14/2019 omeprazole (PRILOSEC) 20 MG capsule Take 20 mg by mouth daily before breakfast.     . triamcinolone cream (KENALOG) 0.1 % Apply 1 application topically See admin instructions. Apply 1 application to affected areas daily as needed for itching    . vitamin B-12 (CYANOCOBALAMIN) 1000 MCG  tablet Take 1,000 mcg by mouth daily.     No current facility-administered medications on file prior to visit.    ALLERGIES: No Known Allergies  FAMILY HISTORY: Family History  Problem Relation Age of Onset  . Hypertension Father   . Lung cancer Father   . Diabetes Father   . Hypertension Mother   . Cerebral aneurysm Daughter    SOCIAL HISTORY: Social History   Socioeconomic History  . Marital status: Married    Spouse name: Not on file  . Number of children: 4  . Years of education: 5  . Highest education level: Not on file  Occupational History  . Occupation: retired    Comment:  photography  Tobacco Use  . Smoking status: Former Smoker    Types: Cigarettes  . Smokeless tobacco: Never Used  Vaping Use  . Vaping Use: Never used  Substance and Sexual Activity  . Alcohol use: Yes    Comment: glass of wine every night (sometimes 2 max)  . Drug use: No  . Sexual activity: Not Currently    Partners: Male  Other Topics Concern  . Not on file  Social History Narrative   Right handed    Apartment   Drinks coffee q day   Social Determinants of Health   Financial Resource Strain: Not on file  Food Insecurity: Not on file  Transportation Needs: Not on file  Physical Activity: Not on file  Stress: Not on file  Social Connections: Not on file  Intimate Partner Violence: Not on file     Objective:  Blood pressure (!) 142/70, pulse 74, resp. rate 20, height 5\' 10"  (1.778 m), weight 194 lb (88 kg), SpO2 99 %. General: No acute distress.  Patient appears well-groomed.   Head:  Normocephalic/atraumatic Eyes:  Fundi examined but not visualized Neck: supple, no paraspinal tenderness, full range of motion Heart:  Regular rate and rhythm Lungs:  Clear to auscultation bilaterally Back: No paraspinal tenderness Neurological Exam: alert and oriented to person, place, and time. Attention span and concentration intact, recent and remote memory intact, fund of knowledge intact.  Speech fluent and not dysarthric, language intact.  CN II-XII intact. Bulk and tone normal, muscle strength 5/5 throughout.  Sensation to light touch, temperature and vibration intact.  Deep tendon reflexes 2+ throughout, toes downgoing.  Finger to nose and heel to shin testing intact.  Gait normal, Romberg negative.   Assessment/Plan:   1.  Left occipital lobe infarcts secondary to severe left PCA stenosis 2.  Hypertension 3.  Hyperlipidemia 4.  Type 2 diabetes mellitus  1. Secondary stroke prevention as per PCP: -  ASA 81mg  daily -  Statin therapy.  LDL goal less than 70 -  Blood pressure  control.  Systolic BP goal to ensure adequate perfusion. -  Glycemic control.  Hgb A1c goal less than 7 2.  Follow up for eye exam. 3.  Follow up in 6 months.  , DO  CC:  [phone], MD

## 2020-04-27 ENCOUNTER — Other Ambulatory Visit: Payer: Self-pay

## 2020-04-27 ENCOUNTER — Encounter: Payer: Self-pay | Admitting: Neurology

## 2020-04-27 ENCOUNTER — Ambulatory Visit: Payer: Medicare Other | Admitting: Neurology

## 2020-04-27 VITALS — BP 142/70 | HR 74 | Resp 20 | Ht 70.0 in | Wt 194.0 lb

## 2020-04-27 DIAGNOSIS — E1159 Type 2 diabetes mellitus with other circulatory complications: Secondary | ICD-10-CM | POA: Diagnosis not present

## 2020-04-27 DIAGNOSIS — I63532 Cerebral infarction due to unspecified occlusion or stenosis of left posterior cerebral artery: Secondary | ICD-10-CM

## 2020-04-27 DIAGNOSIS — I10 Essential (primary) hypertension: Secondary | ICD-10-CM

## 2020-04-27 DIAGNOSIS — E785 Hyperlipidemia, unspecified: Secondary | ICD-10-CM

## 2020-04-27 NOTE — Patient Instructions (Signed)
1.  Continue aspirin 81mg  daily 2.  Continue atorvastatin 3.  Continue blood pressure medication.  Systolic blood pressure goal should be 130-150 4.  Optimize diabetes control 5.  Follow up in 6 months.

## 2020-05-24 ENCOUNTER — Ambulatory Visit: Payer: Medicare Other | Admitting: Neurology

## 2020-07-15 IMAGING — DX DG FEMUR 2+V*R*
4 series · 4 of 4 positions shown · non-contrast
Comparison: None.

CLINICAL DATA: Pain

EXAM:
RIGHT FEMUR 2 VIEWS

[femur ap (1 of 2)]
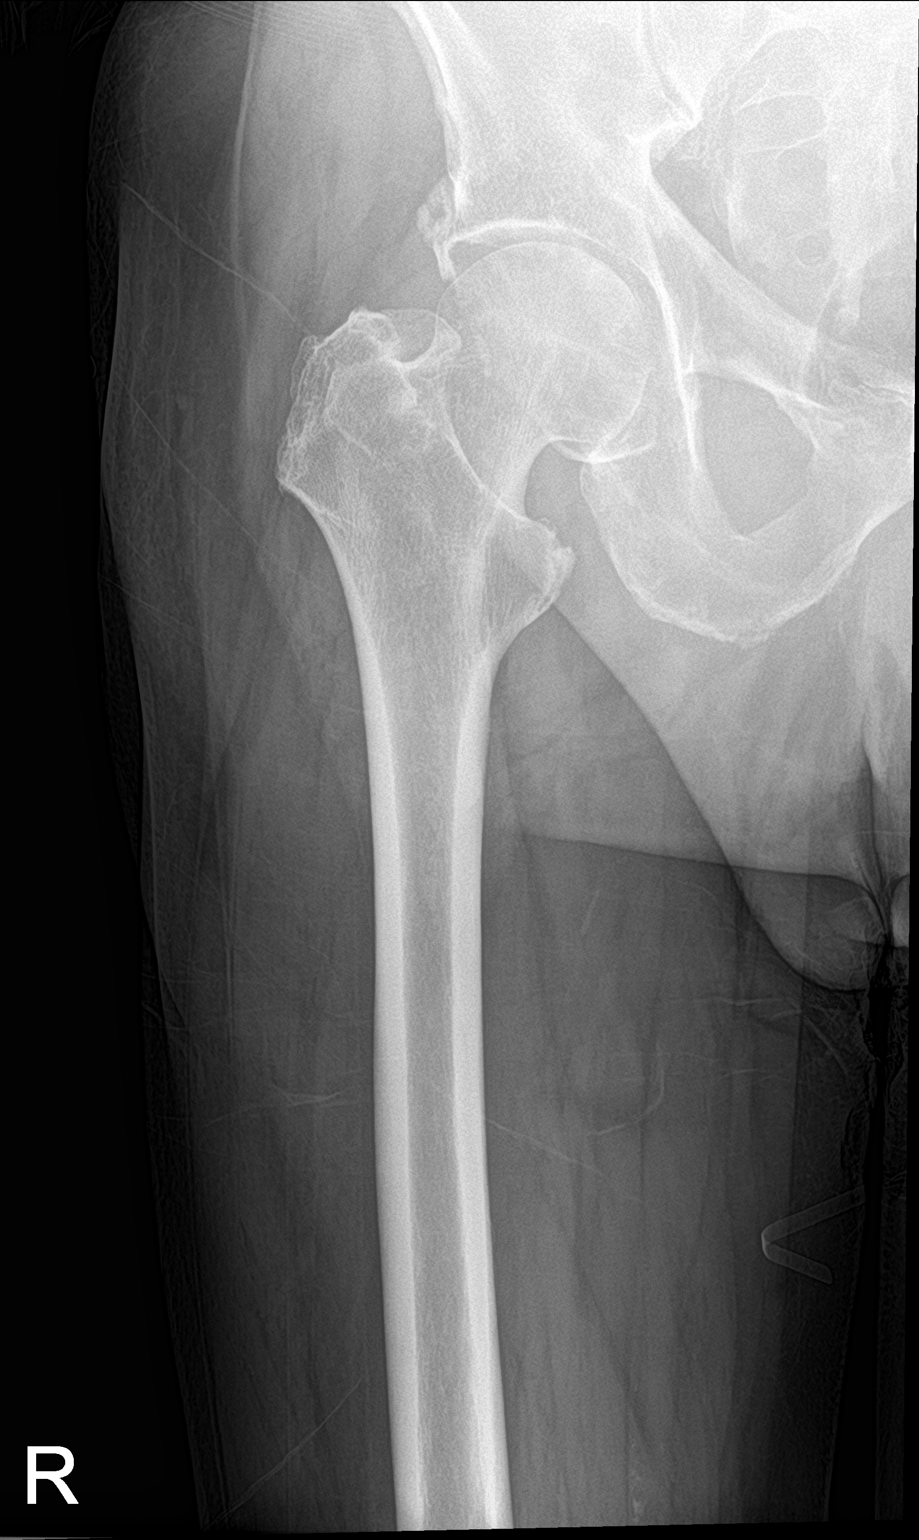

[femur ap (2 of 2)]
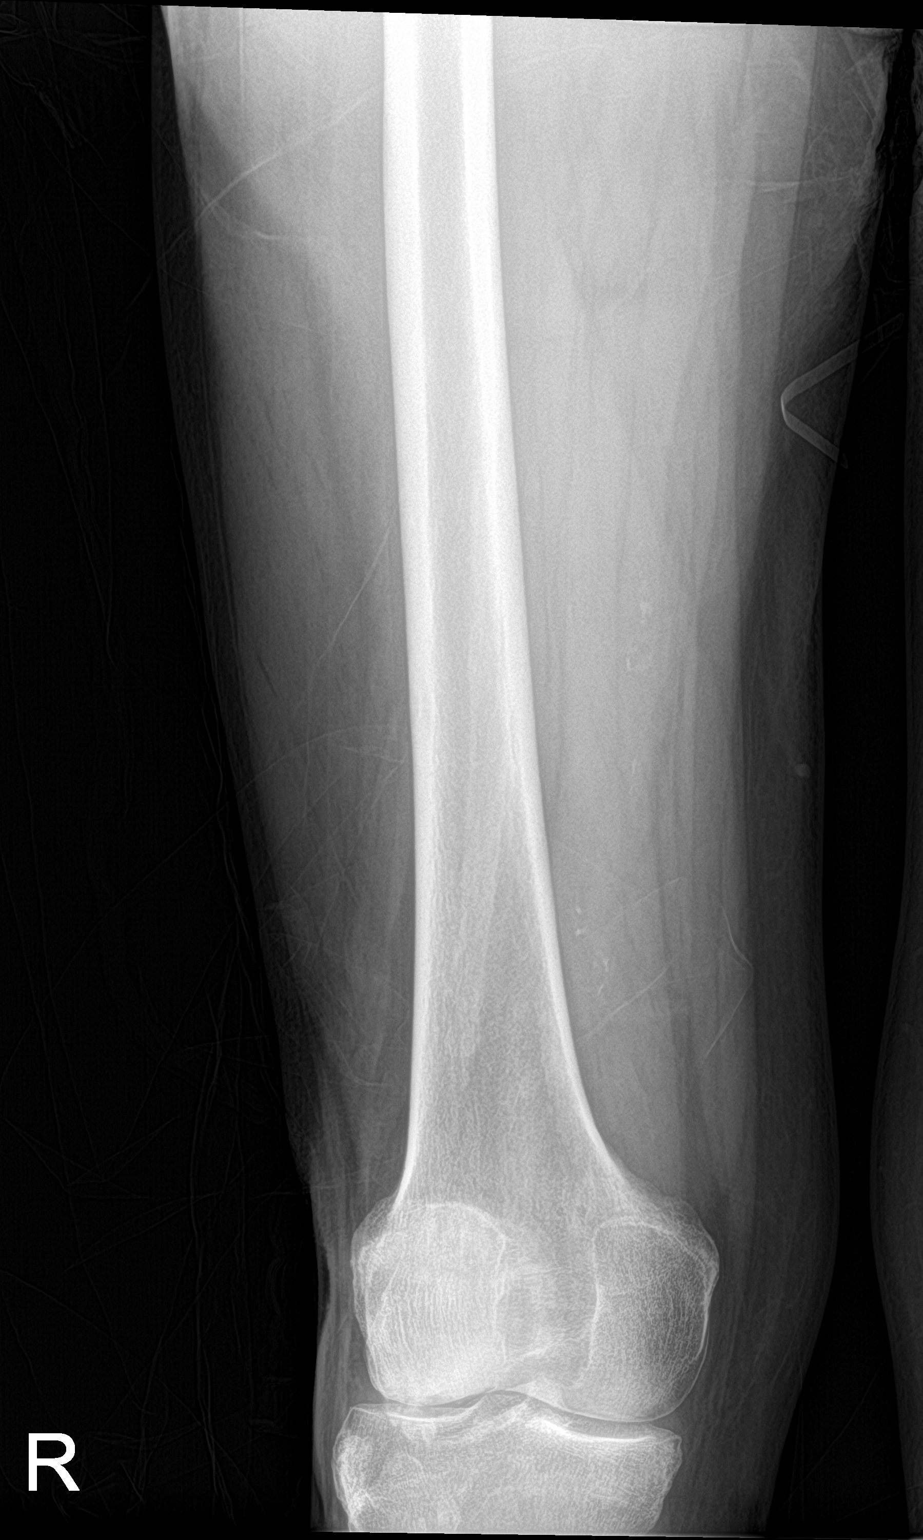

[femur lat (1 of 2)]
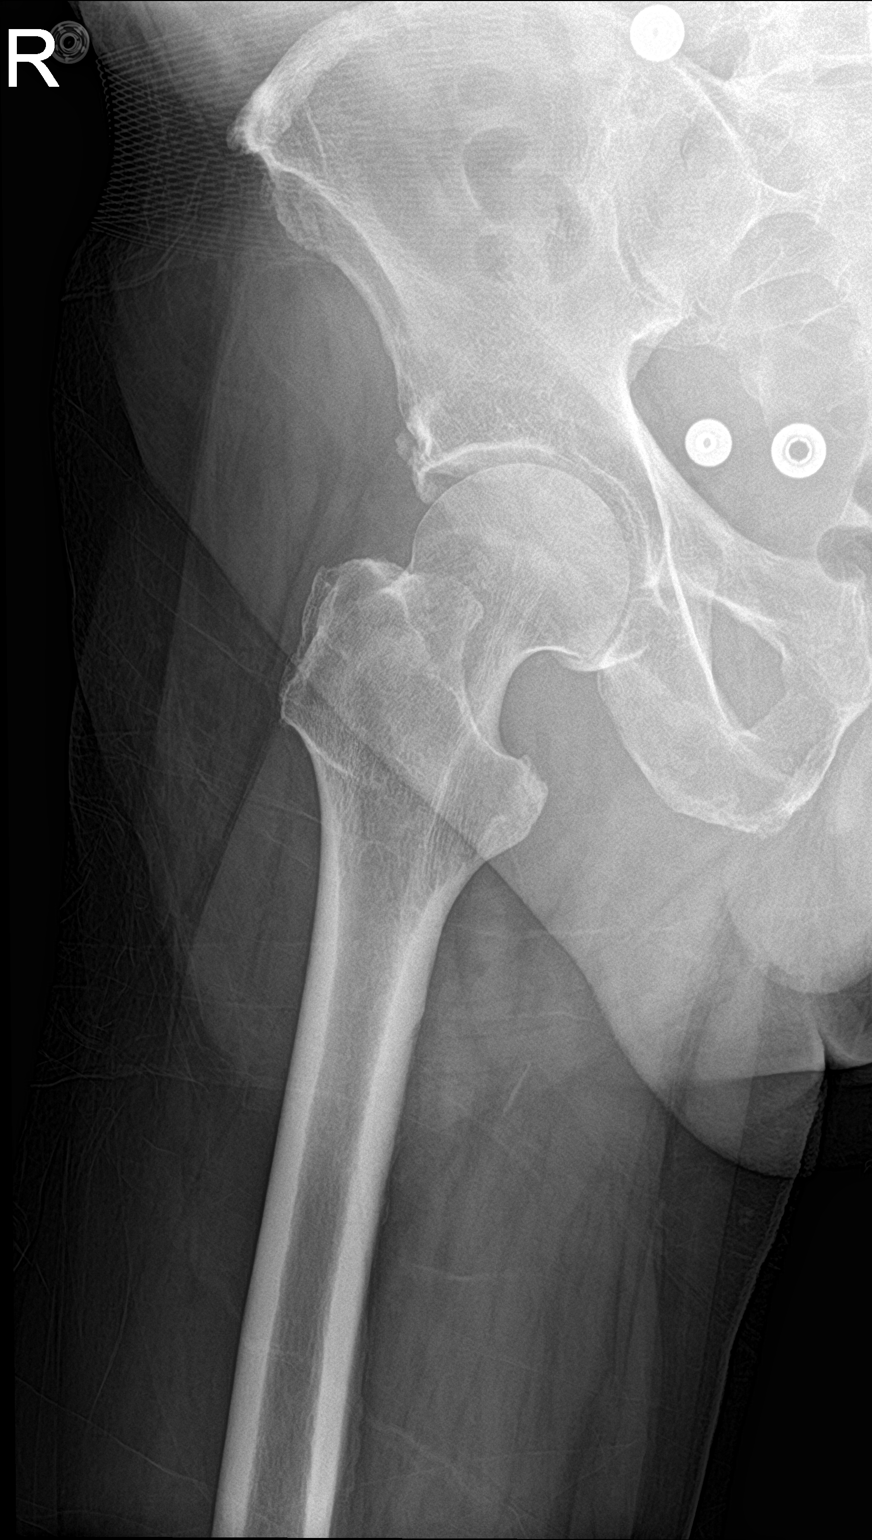

[femur lat (2 of 2)]
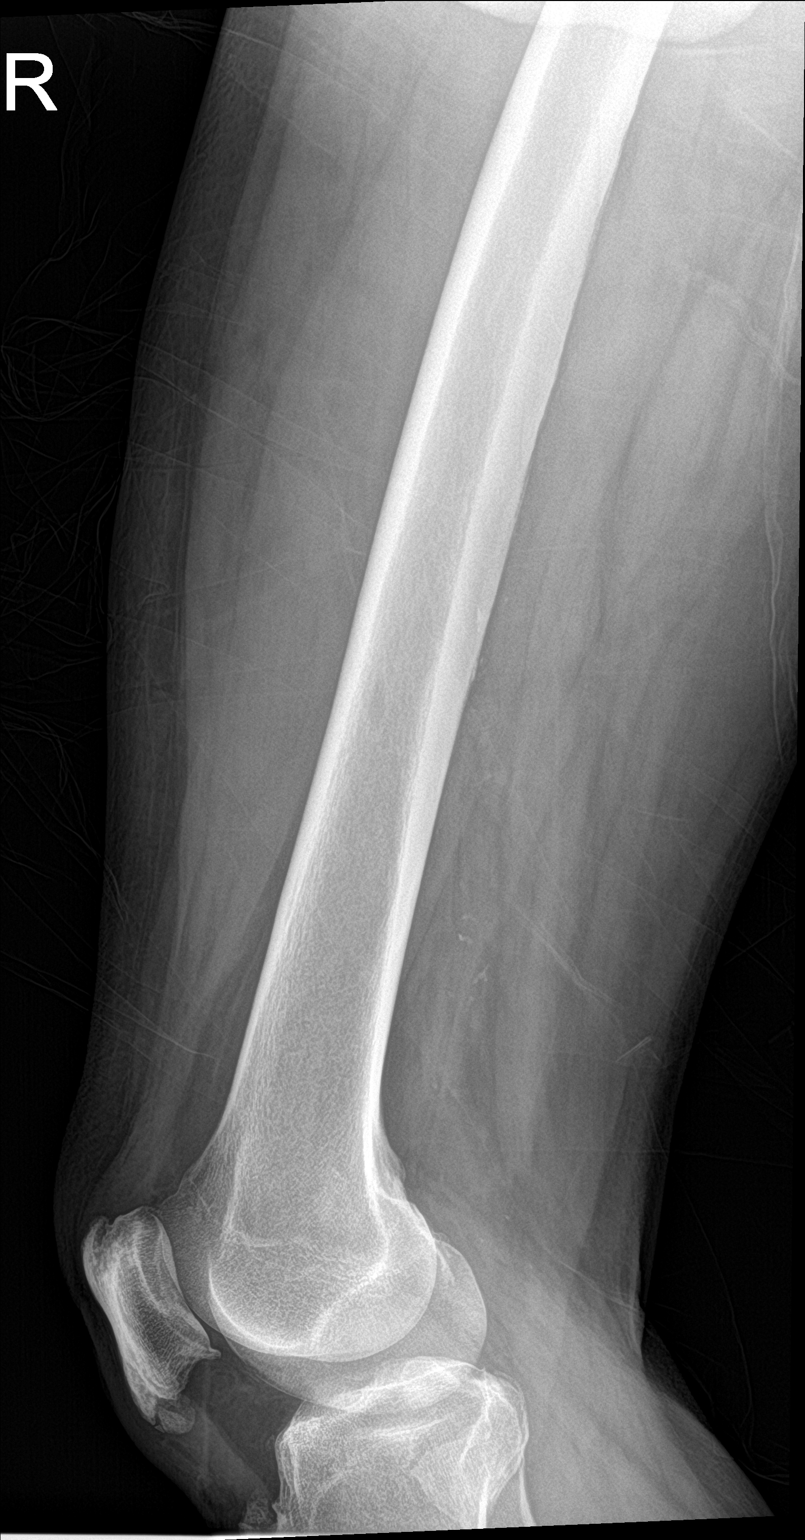

[4 of 4 positions shown; findings below may reference images not displayed]

FINDINGS: No fracture or dislocation. There is moderate right hip
osteoarthritis with superior joint space loss and marginal
osteophyte formation. There is diffuse osteopenia. No focal
overlying soft tissue swelling.
IMPRESSION: No acute osseous abnormality.

## 2020-10-26 ENCOUNTER — Ambulatory Visit: Payer: Medicare Other | Admitting: Neurology

## 2021-03-29 NOTE — Progress Notes (Signed)
NEUROLOGY FOLLOW UP OFFICE NOTE  Joseph Arellano [id number]  Assessment/Plan:   Left occipital lobe infarcts secondary to severe left PCA stenosis Hypertension Hyperlipidemia Type 2 diabetes mellitus  1  Secondary stroke prevention as managed by PCP:  - ASA 81mg  daily  - Statin.  LDL goal less than 70  - Systolic BP goal to ensure adequate perfusion.  - Glycemic control.  Hgb A1c goal less than 7 2  Follow up as needed.  Subjective:  [id number]. Joseph Arellano is an 87 year old right-handed white male with type 2 diabetes mellitus with peripheral neuropathy, HTN, and HLD who follows up for stroke.  He is accompanied by his daughter who supplements history.    UPDATE: Current medications:  ASA 81mg  daily, atorvastatin 20mg , Novolog, Lantus, losartan   In March 2022, he was sitting in the lobby when he felt hot and diffuse weakness.  It was the same feeling he gets when he has elevated blood sugars.  No facial droop, speech disturbance or unilateral numbness and weakness.    HISTORY: He was admitted to [facility] on 06/23/2019 after sudden onset of right sided upper and lower extremity weakness.  His leg gave way and he fell.  On arrival in ED, he presented with right facial droop, dysarthria, ataxia and right drift.  NIHSS was 6.  CT hed showed no acute intracranial abnormality.  CTA of head  And neck showed intracranial atherosclerosis with severe proximal left PCA and left A2 stenosis as well as mild right proximal vertebral artery stenosis, but no emergent large vessel occlusion.  He received IV tPA.  MRI of brain showed chronic small vessel ischemic changes with remote lacunar infarcts in the periventricular white matter but no acute infarct.  Echocardiogram showed EF 60-65% with no cardiac source of embolus.  LDL was 114.  Hgb A1c was 6.9.  He was not on antithrombin therapy prior to admission.  At discharge, he was placed on ASA 81mg  and Plavix 75mg  daily for 3 weeks, followed  by ASA alone.  He was continued on statin and Cholestid.    He was admitted to [facility] on 07/15/2019 for another stroke.  He had developed right sided peripheral vision loss.  MRI of brain showed left PCA territory infarct.  CTA of head and neck revealed severe left PCA stenosis.  He was discharged on ASA 81mg  daily and Plavix 75mg  daily.  PAST MEDICAL HISTORY: Past Medical History:  Diagnosis Date   B12 deficiency    Depression    Diabetes (HCC)    GERD (gastroesophageal reflux disease)    Hypercholesteremia    Hypertension    Peripheral neuropathy    Pre-diabetes    Stroke [facility] [place])     MEDICATIONS: Current Outpatient Medications on File Prior to Visit  Medication Sig Dispense Refill   aspirin EC 81 MG EC tablet Take 1 tablet (81 mg total) by mouth daily.     atorvastatin (LIPITOR) 20 MG tablet Take 1 tablet (20 mg total) by mouth daily at 6 PM. 30 tablet 2   FIBER PO Take 1 capsule by mouth daily.      insulin aspart (NOVOLOG) 100 UNIT/ML FlexPen Inject 16 Units into the skin See admin instructions. Inject 16 units into the skin after breakfast and 16 units after supper     Insulin Glargine (LANTUS SOLOSTAR) 100 UNIT/ML Solostar Pen Inject 18 Units into the skin 2 (two) times daily.      losartan (COZAAR)  50 MG tablet Take 50 mg by mouth in the morning and at bedtime.     methylcellulose (CITRUCEL) oral powder Take 1 packet by mouth daily.     Omega-3 Fatty Acids (FISH OIL PO) Take 1 capsule by mouth daily.      omeprazole (PRILOSEC) 20 MG capsule Take 20 mg by mouth daily before breakfast.      triamcinolone cream (KENALOG) 0.1 % Apply 1 application topically See admin instructions. Apply 1 application to affected areas daily as needed for itching     vitamin B-12 (CYANOCOBALAMIN) 1000 MCG tablet Take 1,000 mcg by mouth daily.     No current facility-administered medications on file prior to visit.    ALLERGIES: No Known Allergies  FAMILY HISTORY: Family History   Problem Relation Age of Onset   Hypertension Father    Lung cancer Father    Diabetes Father    Hypertension Mother    Cerebral aneurysm Daughter       Objective:  Blood pressure (!) 164/75, pulse 84, height 5\' 8"  (1.727 m), weight 202 lb (91.6 kg), SpO2 96 %. General: No acute distress.  Patient appears well-groomed.   Head:  Normocephalic/atraumatic Eyes:  Fundi examined but not visualized Neck: supple, no paraspinal tenderness, full range of motion Heart:  Regular rate and rhythm Lungs:  Clear to auscultation bilaterally Back: No paraspinal tenderness Neurological Exam: alert and oriented to person, place, and time.  Speech fluent and not dysarthric, language intact.  CN II-XII intact. Bulk and tone normal, muscle strength 5/5 throughout.  Sensation to temperature and vibration reduced in feet.  Deep tendon reflexes absent throughout, toes downgoing.  Finger to nose testing intact.  Steady with cane.  Romberg negative   Metta Clines, DO  CC: Reynold Bowen, MD

## 2021-03-30 ENCOUNTER — Other Ambulatory Visit: Payer: Self-pay

## 2021-03-30 ENCOUNTER — Encounter: Payer: Self-pay | Admitting: Neurology

## 2021-03-30 ENCOUNTER — Ambulatory Visit: Payer: Medicare Other | Admitting: Neurology

## 2021-03-30 VITALS — BP 164/75 | HR 84 | Ht 68.0 in | Wt 202.0 lb

## 2021-03-30 DIAGNOSIS — E1142 Type 2 diabetes mellitus with diabetic polyneuropathy: Secondary | ICD-10-CM

## 2021-03-30 DIAGNOSIS — I63532 Cerebral infarction due to unspecified occlusion or stenosis of left posterior cerebral artery: Secondary | ICD-10-CM | POA: Diagnosis not present

## 2021-03-30 DIAGNOSIS — E785 Hyperlipidemia, unspecified: Secondary | ICD-10-CM

## 2021-03-30 DIAGNOSIS — I10 Essential (primary) hypertension: Secondary | ICD-10-CM

## 2021-03-30 NOTE — Patient Instructions (Addendum)
Continue stroke prevention medications as managed by Dr. Evlyn Kanner: - aspirin 81mg  daily -atorvastatin for cholesterol - losartan for blood pressure - medication for diabetes

## 2023-05-22 ENCOUNTER — Other Ambulatory Visit (HOSPITAL_COMMUNITY): Payer: Self-pay | Admitting: Endocrinology

## 2023-05-22 DIAGNOSIS — I10 Essential (primary) hypertension: Secondary | ICD-10-CM

## 2023-06-20 ENCOUNTER — Ambulatory Visit (HOSPITAL_COMMUNITY)
Admission: RE | Admit: 2023-06-20 | Discharge: 2023-06-20 | Disposition: A | Discharge status: Home / Self Care | Payer: PPO | Source: Ambulatory Visit | Attending: Endocrinology | Admitting: Endocrinology

## 2023-06-20 DIAGNOSIS — Z87891 Personal history of nicotine dependence: Secondary | ICD-10-CM | POA: Insufficient documentation

## 2023-06-20 DIAGNOSIS — I10 Essential (primary) hypertension: Secondary | ICD-10-CM | POA: Insufficient documentation

## 2023-06-20 DIAGNOSIS — E119 Type 2 diabetes mellitus without complications: Secondary | ICD-10-CM | POA: Insufficient documentation

## 2023-06-20 LAB — ECHOCARDIOGRAM COMPLETE
AR max vel: 1.93 cm2
AV Area VTI: 2.09 cm2
AV Area mean vel: 2.09 cm2
AV Mean grad: 4 mm[Hg]
AV Peak grad: 7.4 mm[Hg]
Ao pk vel: 1.36 m/s
Area-P 1/2: 6.12 cm2
MV VTI: 2.3 cm2
S' Lateral: 2.8 cm

## 2023-07-09 DIAGNOSIS — L84 Corns and callosities: Secondary | ICD-10-CM | POA: Diagnosis not present

## 2023-07-09 DIAGNOSIS — E1140 Type 2 diabetes mellitus with diabetic neuropathy, unspecified: Secondary | ICD-10-CM | POA: Diagnosis not present

## 2023-07-09 DIAGNOSIS — R946 Abnormal results of thyroid function studies: Secondary | ICD-10-CM | POA: Diagnosis not present

## 2023-07-09 DIAGNOSIS — F330 Major depressive disorder, recurrent, mild: Secondary | ICD-10-CM | POA: Diagnosis not present

## 2023-07-09 DIAGNOSIS — I69391 Dysphagia following cerebral infarction: Secondary | ICD-10-CM | POA: Diagnosis not present

## 2023-07-09 DIAGNOSIS — Z7901 Long term (current) use of anticoagulants: Secondary | ICD-10-CM | POA: Diagnosis not present

## 2023-07-09 DIAGNOSIS — I129 Hypertensive chronic kidney disease with stage 1 through stage 4 chronic kidney disease, or unspecified chronic kidney disease: Secondary | ICD-10-CM | POA: Diagnosis not present

## 2023-07-09 DIAGNOSIS — F0390 Unspecified dementia without behavioral disturbance: Secondary | ICD-10-CM | POA: Diagnosis not present

## 2023-07-09 DIAGNOSIS — Z794 Long term (current) use of insulin: Secondary | ICD-10-CM | POA: Diagnosis not present

## 2023-07-09 DIAGNOSIS — N1831 Chronic kidney disease, stage 3a: Secondary | ICD-10-CM | POA: Diagnosis not present

## 2023-07-09 DIAGNOSIS — I6523 Occlusion and stenosis of bilateral carotid arteries: Secondary | ICD-10-CM | POA: Diagnosis not present

## 2023-07-09 DIAGNOSIS — E785 Hyperlipidemia, unspecified: Secondary | ICD-10-CM | POA: Diagnosis not present

## 2024-01-22 DIAGNOSIS — Z794 Long term (current) use of insulin: Secondary | ICD-10-CM | POA: Diagnosis not present

## 2024-01-22 DIAGNOSIS — I129 Hypertensive chronic kidney disease with stage 1 through stage 4 chronic kidney disease, or unspecified chronic kidney disease: Secondary | ICD-10-CM | POA: Diagnosis not present

## 2024-01-22 DIAGNOSIS — F330 Major depressive disorder, recurrent, mild: Secondary | ICD-10-CM | POA: Diagnosis not present

## 2024-01-22 DIAGNOSIS — K219 Gastro-esophageal reflux disease without esophagitis: Secondary | ICD-10-CM | POA: Diagnosis not present

## 2024-01-22 DIAGNOSIS — R269 Unspecified abnormalities of gait and mobility: Secondary | ICD-10-CM | POA: Diagnosis not present

## 2024-01-22 DIAGNOSIS — I69391 Dysphagia following cerebral infarction: Secondary | ICD-10-CM | POA: Diagnosis not present

## 2024-01-22 DIAGNOSIS — M109 Gout, unspecified: Secondary | ICD-10-CM | POA: Diagnosis not present

## 2024-01-22 DIAGNOSIS — E785 Hyperlipidemia, unspecified: Secondary | ICD-10-CM | POA: Diagnosis not present

## 2024-01-22 DIAGNOSIS — E1140 Type 2 diabetes mellitus with diabetic neuropathy, unspecified: Secondary | ICD-10-CM | POA: Diagnosis not present

## 2024-01-22 DIAGNOSIS — F0390 Unspecified dementia without behavioral disturbance: Secondary | ICD-10-CM | POA: Diagnosis not present

## 2024-01-22 DIAGNOSIS — N1831 Chronic kidney disease, stage 3a: Secondary | ICD-10-CM | POA: Diagnosis not present

## 2024-01-22 DIAGNOSIS — R946 Abnormal results of thyroid function studies: Secondary | ICD-10-CM | POA: Diagnosis not present

## 2024-04-22 DIAGNOSIS — D510 Vitamin B12 deficiency anemia due to intrinsic factor deficiency: Secondary | ICD-10-CM | POA: Diagnosis not present
